# Patient Record
Sex: Female | Born: 2000
Health system: Southern US, Community
[De-identification: ages and names within clinical notes are randomized; demographics above are authoritative.]

## PROBLEM LIST (undated history)

## (undated) ENCOUNTER — Ambulatory Visit (HOSPITAL_COMMUNITY): Admission: EM | Payer: Medicaid Other | Source: Home / Self Care

## (undated) DIAGNOSIS — I10 Essential (primary) hypertension: Secondary | ICD-10-CM

## (undated) DIAGNOSIS — O1495 Unspecified pre-eclampsia, complicating the puerperium: Secondary | ICD-10-CM

## (undated) DIAGNOSIS — D649 Anemia, unspecified: Secondary | ICD-10-CM

## (undated) DIAGNOSIS — K219 Gastro-esophageal reflux disease without esophagitis: Secondary | ICD-10-CM

## (undated) HISTORY — PX: MULTIPLE TOOTH EXTRACTIONS: SHX2053

## (undated) HISTORY — DX: Gastro-esophageal reflux disease without esophagitis: K21.9

## (undated) HISTORY — PX: TYMPANOSTOMY TUBE PLACEMENT: SHX32

---

## 2019-04-12 LAB — OB RESULTS CONSOLE ABO/RH: RH Type: POSITIVE

## 2019-04-12 LAB — OB RESULTS CONSOLE GC/CHLAMYDIA
Chlamydia: NEGATIVE
Gonorrhea: NEGATIVE

## 2019-04-12 LAB — URINE CULTURE: Glucose 1 Hour: 110

## 2019-04-12 LAB — OB RESULTS CONSOLE ANTIBODY SCREEN: Antibody Screen: NEGATIVE

## 2019-04-15 ENCOUNTER — Telehealth: Payer: Self-pay | Admitting: Obstetrics & Gynecology

## 2019-04-15 NOTE — Telephone Encounter (Signed)
The patient was scheduled by the [facility] Helmut Muster). Stated the patient has high blood pressure. Scheduled the patient as a new ob with a high risk. Stated she has presumptive medicaid until Apr 23, 2019.

## 2019-04-20 NOTE — Telephone Encounter (Signed)
Opened in error

## 2019-04-22 ENCOUNTER — Encounter: Payer: Self-pay | Admitting: Obstetrics & Gynecology

## 2019-04-22 ENCOUNTER — Telehealth: Payer: Self-pay | Admitting: Obstetrics & Gynecology

## 2019-04-22 NOTE — Telephone Encounter (Signed)
Attempted to call patient several times today. Number always ringing busy.

## 2019-05-10 ENCOUNTER — Telehealth: Payer: Self-pay | Admitting: Obstetrics and Gynecology

## 2019-05-10 NOTE — Telephone Encounter (Signed)
The patient called in to reschedule the missed appointment.

## 2019-05-12 ENCOUNTER — Ambulatory Visit: Payer: Medicaid Other | Admitting: Obstetrics and Gynecology

## 2019-05-12 ENCOUNTER — Telehealth: Payer: Self-pay | Admitting: Family Medicine

## 2019-05-12 ENCOUNTER — Other Ambulatory Visit: Payer: Self-pay

## 2019-05-12 DIAGNOSIS — Z91199 Patient's noncompliance with other medical treatment and regimen due to unspecified reason: Secondary | ICD-10-CM

## 2019-05-12 DIAGNOSIS — Z5329 Procedure and treatment not carried out because of patient's decision for other reasons: Secondary | ICD-10-CM

## 2019-05-12 NOTE — Progress Notes (Signed)
Patient did not keep her OB transfer appointment for 05/12/2019.  Cornelia Copa MD Attending [facility] [employer] [facility])

## 2019-05-12 NOTE — Telephone Encounter (Signed)
Called patient for her visit today and she did not answer, it's now past her appt time so patient will be rescheduled

## 2019-05-18 DIAGNOSIS — H9212 Otorrhea, left ear: Secondary | ICD-10-CM | POA: Insufficient documentation

## 2019-05-19 ENCOUNTER — Encounter: Payer: Medicaid Other | Admitting: Obstetrics & Gynecology

## 2019-05-19 ENCOUNTER — Other Ambulatory Visit: Payer: Self-pay

## 2019-05-19 ENCOUNTER — Encounter: Payer: Self-pay | Admitting: Obstetrics & Gynecology

## 2019-05-19 DIAGNOSIS — O2340 Unspecified infection of urinary tract in pregnancy, unspecified trimester: Secondary | ICD-10-CM

## 2019-05-19 DIAGNOSIS — O10919 Unspecified pre-existing hypertension complicating pregnancy, unspecified trimester: Secondary | ICD-10-CM | POA: Insufficient documentation

## 2019-05-19 DIAGNOSIS — O0990 Supervision of high risk pregnancy, unspecified, unspecified trimester: Secondary | ICD-10-CM | POA: Insufficient documentation

## 2019-05-19 HISTORY — DX: Unspecified infection of urinary tract in pregnancy, unspecified trimester: O23.40

## 2019-05-19 NOTE — Progress Notes (Signed)
0830 I called [name] for her virtual visit and left a message I will call again in a few minutes; please be available by phone.  Linda,RN  [phone] I called [name] for her virtual visit and left a message I will call again in a few minutes; please be available by phone.  Linda,RN  [phone]  called [name] for her virtual visit and left a message she will need to be rescheduled since we did not reach her; please call our office to reschedule.  Linda,RN

## 2019-05-19 NOTE — Progress Notes (Deleted)
I have reviewed the chart and agree with nursing staff's documentation of this patient's encounter.  Scheryl Darter, MD 05/19/2019 11:37 AM

## 2019-05-26 ENCOUNTER — Other Ambulatory Visit: Payer: Self-pay

## 2019-05-26 ENCOUNTER — Encounter: Payer: Self-pay | Admitting: Obstetrics & Gynecology

## 2019-05-26 ENCOUNTER — Telehealth: Payer: Medicaid Other | Admitting: Family Medicine

## 2019-06-01 ENCOUNTER — Telehealth: Payer: Self-pay | Admitting: Family Medicine

## 2019-06-01 NOTE — Telephone Encounter (Signed)
Spoke with patient, she is aware that her visit is a Pharmacist, community visit.

## 2019-06-02 ENCOUNTER — Encounter: Payer: Self-pay | Admitting: Obstetrics and Gynecology

## 2019-06-02 ENCOUNTER — Other Ambulatory Visit: Payer: Self-pay

## 2019-06-02 ENCOUNTER — Telehealth (INDEPENDENT_AMBULATORY_CARE_PROVIDER_SITE_OTHER): Payer: Medicaid Other | Admitting: Obstetrics and Gynecology

## 2019-06-02 DIAGNOSIS — O10919 Unspecified pre-existing hypertension complicating pregnancy, unspecified trimester: Secondary | ICD-10-CM

## 2019-06-02 DIAGNOSIS — O2342 Unspecified infection of urinary tract in pregnancy, second trimester: Secondary | ICD-10-CM

## 2019-06-02 DIAGNOSIS — O10912 Unspecified pre-existing hypertension complicating pregnancy, second trimester: Secondary | ICD-10-CM | POA: Diagnosis not present

## 2019-06-02 DIAGNOSIS — O0990 Supervision of high risk pregnancy, unspecified, unspecified trimester: Secondary | ICD-10-CM

## 2019-06-02 DIAGNOSIS — Z3A26 26 weeks gestation of pregnancy: Secondary | ICD-10-CM | POA: Diagnosis not present

## 2019-06-02 DIAGNOSIS — D649 Anemia, unspecified: Secondary | ICD-10-CM | POA: Diagnosis not present

## 2019-06-02 DIAGNOSIS — O2340 Unspecified infection of urinary tract in pregnancy, unspecified trimester: Secondary | ICD-10-CM

## 2019-06-02 DIAGNOSIS — O99019 Anemia complicating pregnancy, unspecified trimester: Secondary | ICD-10-CM | POA: Insufficient documentation

## 2019-06-02 MED ORDER — PRENATAL MULTIVITAMIN CH: 1.0000 | ORAL_TABLET | Freq: Every day | ORAL | Status: DC

## 2019-06-02 MED ORDER — FERROUS SULFATE 325 (65 FE) MG PO TABS: 325.0000 mg | ORAL_TABLET | Freq: Two times a day (BID) | ORAL | 1 refills | Status: DC

## 2019-06-02 MED ORDER — CEPHALEXIN 500 MG PO CAPS: 500.0000 mg | ORAL_CAPSULE | Freq: Four times a day (QID) | ORAL | 2 refills | Status: DC

## 2019-06-02 MED ORDER — AMBULATORY NON FORMULARY MEDICATION: 1.0000 | 0 refills | Status: DC

## 2019-06-02 NOTE — Progress Notes (Signed)
I connected with  Anna Welch on 06/02/19 at  2:55 PM EDT by telephone and verified that I am speaking with the correct person using two identifiers.   I discussed the limitations, risks, security and privacy concerns of performing an evaluation and management service by telephone and the availability of in person appointments. I also discussed with the patient that there may be a patient responsible charge related to this service. The patient expressed understanding and agreed to proceed.  [place], [place] 06/02/2019  3:06 PM

## 2019-06-02 NOTE — Progress Notes (Signed)
[place] VIRTUAL VIDEO VISIT ENCOUNTER NOTE  Provider location: [facility] [place] [facility]   I connected with Everrett Coombe on 06/02/19 at  2:55 PM EDT by MyChart Video Encounter at home and verified that I am speaking with the correct person using two identifiers.   I discussed the limitations, risks, security and privacy concerns of performing an evaluation and management service by telephone and the availability of in person appointments. I also discussed with the patient that there may be a patient responsible charge related to this service. The patient expressed understanding and agreed to proceed.  Subjective:  Anna Welch is a 18 y.o. G2P0010 at 92w5dby LMP c/w being seen today for her initial prenatal visit. This is an unplanned pregnancy. She and partner are happy with the pregnancy. She was using nothing for birth control previously. She has an obstetric history significant for n/a. She has a medical history significant for cHTN, no meds, diagnosed at [facility].  Patient reports no complaints.  Contractions: Not present. Vag. Bleeding: None.  Movement: Present. Denies leaking of fluid.    Past Medical History:  Diagnosis Date  . Acid reflux     Past Surgical History:  Procedure Laterality Date  . MULTIPLE TOOTH EXTRACTIONS    . TYMPANOSTOMY TUBE PLACEMENT      OB History  Gravida Para Term Preterm AB Living  2       1    SAB TAB Ectopic Multiple Live Births  1            # Outcome Date GA Lbr Len/2nd Weight Sex Delivery Anes PTL Lv  2 Current           1 SAB 05/2018            Social History   Socioeconomic History  . Marital status: Unknown    Spouse name: Not on file  . Number of children: Not on file  . Years of education: Not on file  . Highest education level: Not on file  Occupational History  . Not on file  Social Needs  . Financial resource strain: Not on file  . Food insecurity:    Worry: Not on file   Inability: Not on file  . Transportation needs:    Medical: Not on file    Non-medical: Not on file  Tobacco Use  . Smoking status: Not on file  Substance and Sexual Activity  . Alcohol use: Not on file  . Drug use: Not on file  . Sexual activity: Not on file  Lifestyle  . Physical activity:    Days per week: Not on file    Minutes per session: Not on file  . Stress: Not on file  Relationships  . Social connections:    Talks on phone: Not on file    Gets together: Not on file    Attends religious service: Not on file    Active member of club or organization: Not on file    Attends meetings of clubs or organizations: Not on file    Relationship status: Not on file  Other Topics Concern  . Not on file  Social History Narrative  . Not on file    History reviewed. No pertinent family history.   Current Outpatient Medications:  .  AMBULATORY NON FORMULARY MEDICATION, 1 Device by Other route once a week. Blood Pressure Cuff/ Medium Monitored Regularly at home ICD 10: O09.90, Disp: 1 kit, Rfl: 0 .  cephALEXin (KEFLEX) 500 MG capsule, Take 1 capsule (500 mg total) by mouth 4 (four) times daily., Disp: 28 capsule, Rfl: 2 .  ferrous sulfate (FERROUSUL) 325 (65 FE) MG tablet, Take 1 tablet (325 mg total) by mouth 2 (two) times daily., Disp: 60 tablet, Rfl: 1  Current Facility-Administered Medications:  .  [START ON 06/03/2019] prenatal multivitamin tablet 1 tablet, 1 tablet, Oral, Q1200, Sloan Leiter, MD  Allergies  Allergen Reactions  . Nitrofurantoin Rash    Review of Systems: Negative except for what is mentioned in HPI.  Objective:  There were no vitals filed for this visit.  Fetal Status:     Movement: Present     General:  Alert, oriented and cooperative. Patient is in no acute distress.  Respiratory: Normal respiratory effort, no problems with respiration noted  Mental Status: Normal mood and affect. Normal behavior. Normal judgment and thought content.  Rest of  physical exam deferred due to type of encounter  Assessment and Plan:  Pregnancy: G2P0010 at 44w5dby LMP  1. Supervision of high risk pregnancy, antepartum - blood pressure cuff ordered today - AMBULATORY NON FORMULARY MEDICATION; 1 Device by Other route once a week. Blood Pressure Cuff/ Medium Monitored Regularly at home ICD 10: O09.90  Dispense: 1 kit; Refill: 0 - prenatal multivitamin tablet 1 tablet - Reviewed [facility], multiple providers, fellows, medical students, virtual visits, MyChart.   2. Preexisting hypertension complicating pregnancy, antepartum - was given baby aspirin but stopped taking it, encouraged her to take it and explained why - AMBULATORY NON FORMULARY MEDICATION; 1 Device by Other route once a week. Blood Pressure Cuff/ Medium Monitored Regularly at home ICD 10: O09.90  Dispense: 1 kit; Refill: 0  3. Urinary tract infection in mother during pregnancy, antepartum Sent keflex to pharmacy   Preterm labor symptoms and general obstetric precautions including but not limited to vaginal bleeding, contractions, leaking of fluid and fetal movement were reviewed in detail with the patient. I discussed the assessment and treatment plan with the patient. The patient was provided an opportunity to ask questions and all were answered. The patient agreed with the plan and demonstrated an understanding of the instructions. The patient was advised to call back or seek an in-person office evaluation/[facility] Please refer to After Visit Summary for other counseling recommendations.   I provided 16 minutes of face-to-face time during this encounter.   Return in about 2 weeks (around 06/16/2019) for OB visit (MD), in person, 3rd trim labs, 2 hr GTT.  KSloan Leiter6/09/2019 3:50 PM

## 2019-06-06 ENCOUNTER — Other Ambulatory Visit: Payer: Self-pay

## 2019-06-06 ENCOUNTER — Encounter (HOSPITAL_COMMUNITY): Payer: Self-pay

## 2019-06-06 ENCOUNTER — Inpatient Hospital Stay (HOSPITAL_COMMUNITY)
Admission: AD | Admit: 2019-06-06 | Discharge: 2019-06-07 | Disposition: A | Discharge status: Home / Self Care | Payer: Medicaid Other | Attending: Obstetrics & Gynecology | Admitting: Obstetrics & Gynecology

## 2019-06-06 ENCOUNTER — Inpatient Hospital Stay (HOSPITAL_BASED_OUTPATIENT_CLINIC_OR_DEPARTMENT_OTHER): Payer: Medicaid Other

## 2019-06-06 DIAGNOSIS — O26852 Spotting complicating pregnancy, second trimester: Secondary | ICD-10-CM | POA: Insufficient documentation

## 2019-06-06 DIAGNOSIS — Z3A27 27 weeks gestation of pregnancy: Secondary | ICD-10-CM | POA: Diagnosis not present

## 2019-06-06 DIAGNOSIS — Z8742 Personal history of other diseases of the female genital tract: Secondary | ICD-10-CM

## 2019-06-06 DIAGNOSIS — Z3689 Encounter for other specified antenatal screening: Secondary | ICD-10-CM

## 2019-06-06 DIAGNOSIS — O4692 Antepartum hemorrhage, unspecified, second trimester: Secondary | ICD-10-CM

## 2019-06-06 LAB — URINALYSIS, ROUTINE W REFLEX MICROSCOPIC
Bilirubin Urine: NEGATIVE
Glucose, UA: NEGATIVE mg/dL
Ketones, ur: NEGATIVE mg/dL
Nitrite: NEGATIVE
Protein, ur: 30 mg/dL — AB
Specific Gravity, Urine: 1.035 — ABNORMAL HIGH (ref 1.005–1.030)
pH: 5 (ref 5.0–8.0)

## 2019-06-06 LAB — COMPREHENSIVE METABOLIC PANEL
ALT: 15 U/L (ref 0–44)
AST: 19 U/L (ref 15–41)
Albumin: 3 g/dL — ABNORMAL LOW (ref 3.5–5.0)
Alkaline Phosphatase: 58 U/L (ref 38–126)
Anion gap: 6 (ref 5–15)
BUN: 7 mg/dL (ref 6–20)
CO2: 24 mmol/L (ref 22–32)
Calcium: 9.5 mg/dL (ref 8.9–10.3)
Chloride: 108 mmol/L (ref 98–111)
Creatinine, Ser: 0.58 mg/dL (ref 0.44–1.00)
GFR calc Af Amer: >60 mL/min (ref >60)
GFR calc non Af Amer: >60 mL/min (ref >60)
Glucose, Bld: 95 mg/dL (ref 70–99)
Potassium: 3.9 mmol/L (ref 3.5–5.1)
Sodium: 138 mmol/L (ref 135–145)
Total Bilirubin: 0.2 mg/dL — ABNORMAL LOW (ref 0.3–1.2)
Total Protein: 6.4 g/dL — ABNORMAL LOW (ref 6.5–8.1)

## 2019-06-06 LAB — WET PREP, GENITAL
Clue Cells Wet Prep HPF POC: NONE SEEN
Sperm: NONE SEEN
Trich, Wet Prep: NONE SEEN

## 2019-06-06 LAB — CBC
HCT: 28.5 % — ABNORMAL LOW (ref 36.0–46.0)
Hemoglobin: 8.7 g/dL — ABNORMAL LOW (ref 12.0–15.0)
MCH: 21.1 pg — ABNORMAL LOW (ref 26.0–34.0)
MCHC: 30.5 g/dL (ref 30.0–36.0)
MCV: 69.2 fL — ABNORMAL LOW (ref 80.0–100.0)
Platelets: 177 10*3/uL (ref 150–400)
RBC: 4.12 MIL/uL (ref 3.87–5.11)
RDW: 17 % — ABNORMAL HIGH (ref 11.5–15.5)
WBC: 7.3 10*3/uL (ref 4.0–10.5)
nRBC: 0 % (ref 0.0–0.2)

## 2019-06-06 LAB — PROTEIN / CREATININE RATIO, URINE
Creatinine, Urine: 317.52 mg/dL
Protein Creatinine Ratio: 0.06 mg/mg{Cre} (ref 0.00–0.15)
Total Protein, Urine: 18 mg/dL

## 2019-06-06 MED ORDER — TERCONAZOLE 0.4 % VA CREA: 1.0000 | TOPICAL_CREAM | Freq: Every day | VAGINAL | 0 refills | Status: DC

## 2019-06-06 MED ORDER — ASPIRIN 81 MG PO CHEW: 81.0000 mg | CHEWABLE_TABLET | Freq: Every day | ORAL | 2 refills | Status: DC

## 2019-06-06 NOTE — MAU Provider Note (Signed)
Patient Anna Welch is a 18 y.o. G2P0010 At 72w2dhere with complaints of one episode of vaginal spotting this evening and a cramp this morning. She denies abnormal discharge, decreased fetal movements, LOF, dysuria, low back pain, or other ob-gyn complaint.   She is a CHTN not on medicine; [facility]  History     CSN: [id number] Arrival date and time: 06/06/19 2012   None     No chief complaint on file.  Vaginal Bleeding The patient's primary symptoms include vaginal bleeding. This is a new problem. The current episode started today. The problem occurs rarely. The problem has been resolved. She is pregnant. The vaginal discharge was bloody. The vaginal bleeding is spotting. She has not been passing clots. She has not been passing tissue. Nothing aggravates the symptoms. She has tried nothing for the symptoms.   Patient states that she had bright red blood on her toilet paper when she wiped this evening at 6:30pm. She also felt a cramp this morning that lasted two minutes. She was concerned about a miscarriage so she came in to be seen.  OB History    Gravida  2   Para      Term      Preterm      AB  1   Living        SAB  1   TAB      Ectopic      Multiple      Live Births              Past Medical History:  Diagnosis Date  . Acid reflux     Past Surgical History:  Procedure Laterality Date  . MULTIPLE TOOTH EXTRACTIONS    . TYMPANOSTOMY TUBE PLACEMENT      History reviewed. No pertinent family history.  Social History   Tobacco Use  . Smoking status: Never Smoker  Substance Use Topics  . Alcohol use: Not Currently  . Drug use: Not Currently    Allergies:  Allergies  Allergen Reactions  . Nitrofurantoin Rash    Facility-Administered Medications Prior to Admission  Medication Dose Route Frequency Provider Last Rate Last Dose  . prenatal multivitamin tablet 1 tablet  1 tablet Oral  Q1200 DSloan Leiter MD       Medications Prior to Admission  Medication Sig Dispense Refill Last Dose  . AMBULATORY NON FORMULARY MEDICATION 1 Device by Other route once a week. Blood Pressure Cuff/ Medium Monitored Regularly at home ICD 10: O09.90 1 kit 0   . cephALEXin (KEFLEX) 500 MG capsule Take 1 capsule (500 mg total) by mouth 4 (four) times daily. 28 capsule 2   . ferrous sulfate (FERROUSUL) 325 (65 FE) MG tablet Take 1 tablet (325 mg total) by mouth 2 (two) times daily. 60 tablet 1     Review of Systems  Constitutional: Negative.   HENT: Negative.   Respiratory: Negative.   Gastrointestinal: Negative.   Genitourinary: Positive for vaginal bleeding.  Neurological: Negative.   Psychiatric/Behavioral: Negative.    Physical Exam   Blood pressure 130/79, pulse (!) 102, temperature 98.2 F (36.8 C), resp. rate 16, height '5\' 8"'  (1.727 m), weight (!) 137 kg, last menstrual period 11/27/2018.  Physical Exam  Constitutional: She is oriented to person, place, and time. She appears well-developed and well-nourished.  HENT:  Head: Normocephalic.  Neck: Normal range of motion.  Respiratory: Effort normal.  GI: Soft. Bowel sounds are normal.  Genitourinary:    Vagina normal.     Genitourinary Comments: NEFG; vaginal walls and pink and ruggated, no lesions on vaginal walls or cervix. Light pink discharge in the vagina, no active bleeding. White clumpy discharge also present in the vagina.    Musculoskeletal: Normal range of motion.  Neurological: She is alert and oriented to person, place, and time.  Skin: Skin is warm and dry.  Psychiatric: She has a normal mood and affect.  Cervix is long, closed and thick.   MAU Course  Procedures  MDM -NST: 145 bpm, mod var, present acel, neg decels, no contractions.  -Patient has CHTN; will send baseline labs as patient not seen again until end of June and has had limited prenatal care in this pregnancy.  -will re-send urine for culture  as patient did not take her keflex  -US shows no evidence of placental previa or abruption; patient has had no vaginal bleeding while in MAU.  Assessment and Plan   1. H/O vaginal bleeding   2. NST (non-stress test) reactive    -continue taking baby ASA -keep appt on 6-26 -Urine culture sent; knows that we will call her with results if she needs to change her treatment, otherwise she should keep taking her keflex. -RX for [employer] for yeast given -baseline labs pending -Reviewed warning signs and when to return to MAU.  Mervyn Skeeters [place] 06/06/2019, 9:37 PM

## 2019-06-06 NOTE — MAU Note (Signed)
Pt felt mild lower abd pain earlier today that has since gone away. AT 1830 tonight she started noticing scant red vag bleeding. Reports good fetal movement, denies any vag leaking of fluid.

## 2019-06-06 NOTE — Discharge Instructions (Signed)
-take baby aspirin every day -Take Terazole for 7 days at night (put a pad on) for yeast  -Take Keflex 4 times a day for 10 days, as you were prescribed by Dr. Rosana Hoes.   Preeclampsia and Eclampsia  Preeclampsia is a serious condition that may develop during pregnancy. It is also called toxemia of pregnancy. This condition causes high blood pressure along with other symptoms, such as swelling and headaches. These symptoms may develop as the condition gets worse. Preeclampsia may occur at 20 weeks of pregnancy or later. Diagnosing and treating preeclampsia early is very important. If not treated early, it can cause serious problems for you and your baby. One problem it can lead to is eclampsia. Eclampsia is a condition that causes muscle jerking or shaking (convulsions or seizures) and other serious problems for the mother. During pregnancy, delivering your baby may be the best treatment for preeclampsia or eclampsia. For most women, preeclampsia and eclampsia symptoms go away after giving birth. In rare cases, a woman may develop preeclampsia after giving birth (postpartum preeclampsia). This usually occurs within 48 hours after childbirth but may occur up to 6 weeks after giving birth. What are the causes? The cause of preeclampsia is not known. What increases the risk? The following risk factors make you more likely to develop preeclampsia:  Being pregnant for the first time.  Having had preeclampsia during a past pregnancy.  Having a family history of preeclampsia.  Having high blood pressure.  Being pregnant with more than one baby.  Being 98 or older.  Being African-American.  Having kidney disease or diabetes.  Having medical conditions such as lupus or blood diseases.  Being very overweight (obese). What are the signs or symptoms? The earliest signs of preeclampsia are:  High blood pressure.  Increased protein in your urine. Your health care provider will check for this at  every visit before you give birth (prenatal visit). Other symptoms that may develop as the condition gets worse include:  Severe headaches.  Sudden weight gain.  Swelling of the hands, face, legs, and feet.  Nausea and vomiting.  Vision problems, such as blurred or double vision.  Numbness in the face, arms, legs, and feet.  Urinating less than usual.  Dizziness.  Slurred speech.  Abdominal pain, especially upper abdominal pain.  Convulsions or seizures. How is this diagnosed? There are no screening tests for preeclampsia. Your health care provider will ask you about symptoms and check for signs of preeclampsia during your prenatal visits. You may also have tests that include:  Urine tests.  Blood tests.  Checking your blood pressure.  Monitoring your babys heart rate.  Ultrasound. How is this treated? You and your health care provider will determine the treatment approach that is best for you. Treatment may include:  Having more frequent prenatal exams to check for signs of preeclampsia, if you have an increased risk for preeclampsia.  Medicine to lower your blood pressure.  Staying in the hospital, if your condition is severe. There, treatment will focus on controlling your blood pressure and the amount of fluids in your body (fluid retention).  Taking medicine (magnesium sulfate) to prevent seizures. This may be given as an injection or through an IV.  Taking a low-dose aspirin during your pregnancy.  Delivering your baby early, if your condition gets worse. You may have your labor started with medicine (induced), or you may have a cesarean delivery. Follow these instructions at home: Eating and drinking   Drink enough fluid  to keep your urine pale yellow.  Avoid caffeine. Lifestyle  Do not use any products that contain nicotine or tobacco, such as cigarettes and e-cigarettes. If you need help quitting, ask your health care provider.  Do not use  alcohol or drugs.  Avoid stress as much as possible. Rest and get plenty of sleep. General instructions  Take over-the-counter and prescription medicines only as told by your health care provider.  When lying down, lie on your left side. This keeps pressure off your major blood vessels.  When sitting or lying down, raise (elevate) your feet. Try putting some pillows underneath your lower legs.  Exercise regularly. Ask your health care provider what kinds of exercise are best for you.  Keep all follow-up and prenatal visits as told by your health care provider. This is important. How is this prevented? There is no known way of preventing preeclampsia or eclampsia from developing. However, to lower your risk of complications and detect problems early:  Get regular prenatal care. Your health care provider may be able to diagnose and treat the condition early.  Maintain a healthy weight. Ask your health care provider for help managing weight gain during pregnancy.  Work with your health care provider to manage any long-term (chronic) health conditions you have, such as diabetes or kidney problems.  You may have tests of your blood pressure and kidney function after giving birth.  Your health care provider may have you take low-dose aspirin during your next pregnancy. Contact a health care provider if:  You have symptoms that your health care provider told you may require more treatment or monitoring, such as: ? Headaches. ? Nausea or vomiting. ? Abdominal pain. ? Dizziness. ? Light-headedness. Get help right away if:  You have severe: ? Abdominal pain. ? Headaches that do not get better. ? Dizziness. ? Vision problems. ? Confusion. ? Nausea or vomiting.  You have any of the following: ? A seizure. ? Sudden, rapid weight gain. ? Sudden swelling in your hands, ankles, or face. ? Trouble moving any part of your body. ? Numbness in any part of your body. ? Trouble  speaking. ? Abnormal bleeding.  You faint. Summary  Preeclampsia is a serious condition that may develop during pregnancy. It is also called toxemia of pregnancy.  This condition causes high blood pressure along with other symptoms, such as swelling and headaches.  Diagnosing and treating preeclampsia early is very important. If not treated early, it can cause serious problems for you and your baby.  Get help right away if you have symptoms that your health care provider told you to watch for. This information is not intended to replace advice given to you by your health care provider. Make sure you discuss any questions you have with your health care provider. Document Released: 12/06/2000 Document Revised: 11/25/2017 Document Reviewed: 07/15/2016 Elsevier Interactive Patient Education  2019 [employer]

## 2019-06-07 ENCOUNTER — Telehealth: Payer: Self-pay | Admitting: Advanced Practice Midwife

## 2019-06-07 ENCOUNTER — Other Ambulatory Visit: Payer: Self-pay | Admitting: Student

## 2019-06-07 ENCOUNTER — Telehealth: Payer: Self-pay | Admitting: General Practice

## 2019-06-07 DIAGNOSIS — D508 Other iron deficiency anemias: Secondary | ICD-10-CM

## 2019-06-07 LAB — GC/CHLAMYDIA PROBE AMP (~~LOC~~) NOT AT ARMC
Chlamydia: NEGATIVE
Neisseria Gonorrhea: NEGATIVE

## 2019-06-07 MED ORDER — FERROUS FUMARATE 325 (106 FE) MG PO TABS: 1.0000 | ORAL_TABLET | Freq: Every day | ORAL | 3 refills | Status: DC

## 2019-06-07 NOTE — Telephone Encounter (Signed)
-----   Message from Starr Lake, [place] sent at 06/07/2019  7:43 AM EDT ----- Regarding: patient needs iron infusion Hello! This patient has a hemoglobin of 8.7; she is asymptomatic. SHe needs an iron infusion weekly for two weeks; do you mind to call her and set it up? I just tried to call her to tell her she would be contacted about it, and she was asleep.  Thank you!  Maye Hides

## 2019-06-07 NOTE — Telephone Encounter (Signed)
Scheduled 1st feraheme infusion 6/19 @ 10am. Called patient, no answer- left message to call us back regarding an appt we have set up for her later this week.  Will send mychart message.

## 2019-06-07 NOTE — Telephone Encounter (Signed)
The patient called in stating she received a call and was told to call back. Informed the patient of the upcoming appointment as the previous call was intended for the same purpose. The patient stated she is aware appointment date and times.

## 2019-06-09 LAB — CULTURE, OB URINE: Culture: 30000 — AB

## 2019-06-11 ENCOUNTER — Encounter (HOSPITAL_COMMUNITY)
Admission: RE | Admit: 2019-06-11 | Discharge: 2019-06-11 | Disposition: A | Discharge status: Home / Self Care | Payer: Medicaid Other | Source: Ambulatory Visit | Attending: Student | Admitting: Student

## 2019-06-11 ENCOUNTER — Other Ambulatory Visit: Payer: Self-pay

## 2019-06-11 DIAGNOSIS — O99012 Anemia complicating pregnancy, second trimester: Secondary | ICD-10-CM | POA: Insufficient documentation

## 2019-06-11 MED ORDER — SODIUM CHLORIDE 0.9 % IV SOLN
510.0000 mg | INTRAVENOUS | Status: DC
  Administered 2019-06-11: 11:00:00 510 mg via INTRAVENOUS
  Filled 2019-06-11: qty 510

## 2019-06-11 NOTE — Discharge Instructions (Signed)

## 2019-06-17 ENCOUNTER — Telehealth: Payer: Self-pay | Admitting: Family Medicine

## 2019-06-17 ENCOUNTER — Other Ambulatory Visit: Payer: Self-pay | Admitting: General Practice

## 2019-06-17 DIAGNOSIS — O0990 Supervision of high risk pregnancy, unspecified, unspecified trimester: Secondary | ICD-10-CM

## 2019-06-17 NOTE — Telephone Encounter (Signed)
Attempted to call patient to ask her about any symptoms, and to wear her mask the whole visit covering her nose and mouth, and no visitors.  Also, to sanitize her hands upon arriving. A message was left on her voicemail.  °

## 2019-06-18 ENCOUNTER — Inpatient Hospital Stay (HOSPITAL_COMMUNITY)
Admission: RE | Admit: 2019-06-18 | Discharge: 2019-06-18 | Disposition: A | Discharge status: Home / Self Care | Payer: Medicaid Other | Source: Ambulatory Visit | Attending: Student | Admitting: Student

## 2019-06-18 ENCOUNTER — Encounter: Payer: Medicaid Other | Admitting: Family Medicine

## 2019-06-18 ENCOUNTER — Encounter: Payer: Self-pay | Admitting: Family Medicine

## 2019-06-18 ENCOUNTER — Telehealth: Payer: Self-pay | Admitting: Family Medicine

## 2019-06-18 ENCOUNTER — Other Ambulatory Visit: Payer: Medicaid Other

## 2019-06-18 NOTE — Telephone Encounter (Signed)
Called Anna Welch to get her appointment rescheduled. I spoke with Celesta Gentile, and he stated she had already gone to her appointment. He stated she had three appointments today, and wanted to know was everything ok. I just asked him to have her to call our office. He said ok.

## 2019-06-18 NOTE — Progress Notes (Signed)
Patient did not keep appointment today. She will be called to reschedule.  

## 2019-06-28 ENCOUNTER — Telehealth: Payer: Self-pay | Admitting: Family Medicine

## 2019-06-28 ENCOUNTER — Encounter: Payer: Self-pay | Admitting: Family Medicine

## 2019-06-28 NOTE — Telephone Encounter (Signed)
Attempted to call patient about her appointment. Left a message on her voice mailbox to call us back to get scheduled.

## 2019-06-29 ENCOUNTER — Telehealth: Payer: Self-pay | Admitting: Obstetrics & Gynecology

## 2019-06-29 NOTE — Telephone Encounter (Signed)
Left a message for her to get in touch with [place]. It is important that we reach you about your appointment.

## 2019-06-30 ENCOUNTER — Encounter: Payer: Self-pay | Admitting: *Deleted

## 2019-06-30 ENCOUNTER — Telehealth: Payer: Self-pay | Admitting: Obstetrics & Gynecology

## 2019-06-30 ENCOUNTER — Encounter: Payer: Medicaid Other | Admitting: Obstetrics & Gynecology

## 2019-06-30 NOTE — Telephone Encounter (Signed)
Called the patient in regards to the missed appointment. Left a detailed voicemail. Also mailing a missed appointment letter.

## 2019-06-30 NOTE — Progress Notes (Deleted)
   Patient did not show up today for her scheduled appointment.   Glennice Marcos, MD, FACOG Obstetrician & Gynecologist, [facility], [facility]  

## 2019-06-30 NOTE — Progress Notes (Signed)
Per pt mychart message, pt is no longer getting her prenatal care from [place] but instead at [facility] in [place].

## 2019-07-07 NOTE — Addendum Note (Signed)
Encounter addended by: Starr Lake, CNM on: 07/07/2019 9:39 AM  Actions taken: Problem List modified

## 2019-09-14 ENCOUNTER — Emergency Department (HOSPITAL_COMMUNITY): Payer: Medicaid Other

## 2019-09-14 ENCOUNTER — Encounter (HOSPITAL_COMMUNITY): Payer: Self-pay | Admitting: *Deleted

## 2019-09-14 ENCOUNTER — Inpatient Hospital Stay (HOSPITAL_COMMUNITY)
Admission: AD | Admit: 2019-09-14 | Discharge: 2019-09-17 | DRG: 776 | Disposition: A | Discharge status: Home / Self Care | Payer: Medicaid Other | Attending: Obstetrics and Gynecology | Admitting: Obstetrics and Gynecology

## 2019-09-14 ENCOUNTER — Other Ambulatory Visit: Payer: Self-pay

## 2019-09-14 ENCOUNTER — Inpatient Hospital Stay (HOSPITAL_COMMUNITY): Payer: Medicaid Other

## 2019-09-14 DIAGNOSIS — O9953 Diseases of the respiratory system complicating the puerperium: Secondary | ICD-10-CM | POA: Diagnosis present

## 2019-09-14 DIAGNOSIS — R0602 Shortness of breath: Secondary | ICD-10-CM | POA: Diagnosis present

## 2019-09-14 DIAGNOSIS — R0902 Hypoxemia: Secondary | ICD-10-CM

## 2019-09-14 DIAGNOSIS — R778 Other specified abnormalities of plasma proteins: Secondary | ICD-10-CM

## 2019-09-14 DIAGNOSIS — O8600 Infection of obstetric surgical wound, unspecified: Secondary | ICD-10-CM | POA: Diagnosis present

## 2019-09-14 DIAGNOSIS — J810 Acute pulmonary edema: Secondary | ICD-10-CM

## 2019-09-14 DIAGNOSIS — Z20828 Contact with and (suspected) exposure to other viral communicable diseases: Secondary | ICD-10-CM | POA: Diagnosis present

## 2019-09-14 DIAGNOSIS — O1093 Unspecified pre-existing hypertension complicating the puerperium: Secondary | ICD-10-CM | POA: Diagnosis present

## 2019-09-14 DIAGNOSIS — O1003 Pre-existing essential hypertension complicating the puerperium: Secondary | ICD-10-CM | POA: Diagnosis present

## 2019-09-14 DIAGNOSIS — O10919 Unspecified pre-existing hypertension complicating pregnancy, unspecified trimester: Secondary | ICD-10-CM

## 2019-09-14 DIAGNOSIS — O2343 Unspecified infection of urinary tract in pregnancy, third trimester: Secondary | ICD-10-CM

## 2019-09-14 DIAGNOSIS — I340 Nonrheumatic mitral (valve) insufficiency: Secondary | ICD-10-CM

## 2019-09-14 DIAGNOSIS — O115 Pre-existing hypertension with pre-eclampsia, complicating the puerperium: Principal | ICD-10-CM | POA: Diagnosis present

## 2019-09-14 DIAGNOSIS — O0990 Supervision of high risk pregnancy, unspecified, unspecified trimester: Secondary | ICD-10-CM

## 2019-09-14 DIAGNOSIS — Z98891 History of uterine scar from previous surgery: Secondary | ICD-10-CM

## 2019-09-14 LAB — BASIC METABOLIC PANEL
Anion gap: 8 (ref 5–15)
BUN: <5 mg/dL — ABNORMAL LOW (ref 6–20)
CO2: 24 mmol/L (ref 22–32)
Calcium: 8.7 mg/dL — ABNORMAL LOW (ref 8.9–10.3)
Chloride: 110 mmol/L (ref 98–111)
Creatinine, Ser: 0.65 mg/dL (ref 0.44–1.00)
GFR calc Af Amer: >60 mL/min (ref >60)
GFR calc non Af Amer: >60 mL/min (ref >60)
Glucose, Bld: 92 mg/dL (ref 70–99)
Potassium: 4 mmol/L (ref 3.5–5.1)
Sodium: 142 mmol/L (ref 135–145)

## 2019-09-14 LAB — CBC
HCT: 23.6 % — ABNORMAL LOW (ref 36.0–46.0)
Hemoglobin: 7.1 g/dL — ABNORMAL LOW (ref 12.0–15.0)
MCH: 23.3 pg — ABNORMAL LOW (ref 26.0–34.0)
MCHC: 30.1 g/dL (ref 30.0–36.0)
MCV: 77.4 fL — ABNORMAL LOW (ref 80.0–100.0)
Platelets: 240 10*3/uL (ref 150–400)
RBC: 3.05 MIL/uL — ABNORMAL LOW (ref 3.87–5.11)
RDW: 17.2 % — ABNORMAL HIGH (ref 11.5–15.5)
WBC: 7.8 10*3/uL (ref 4.0–10.5)
nRBC: 0.6 % — ABNORMAL HIGH (ref 0.0–0.2)

## 2019-09-14 LAB — HEPATIC FUNCTION PANEL
ALT: 17 U/L (ref 0–44)
AST: 21 U/L (ref 15–41)
Albumin: 2.7 g/dL — ABNORMAL LOW (ref 3.5–5.0)
Alkaline Phosphatase: 82 U/L (ref 38–126)
Bilirubin, Direct: <0.1 mg/dL (ref 0.0–0.2)
Total Bilirubin: 0.4 mg/dL (ref 0.3–1.2)
Total Protein: 6 g/dL — ABNORMAL LOW (ref 6.5–8.1)

## 2019-09-14 LAB — TROPONIN I (HIGH SENSITIVITY)
Troponin I (High Sensitivity): 66 ng/L — ABNORMAL HIGH (ref <18)
Troponin I (High Sensitivity): 50 ng/L — ABNORMAL HIGH (ref <18)

## 2019-09-14 LAB — SARS CORONAVIRUS 2 (TAT 6-24 HRS): SARS Coronavirus 2: NEGATIVE

## 2019-09-14 LAB — ECHOCARDIOGRAM COMPLETE: Height: 68 in

## 2019-09-14 LAB — D-DIMER, QUANTITATIVE: D-Dimer, Quant: 7.55 ug/mL-FEU — ABNORMAL HIGH (ref 0.00–0.50)

## 2019-09-14 MED ORDER — LABETALOL HCL 5 MG/ML IV SOLN
INTRAVENOUS | Status: AC
  Administered 2019-09-14: 40 mg via INTRAVENOUS
  Filled 2019-09-14: qty 4

## 2019-09-14 MED ORDER — FUROSEMIDE 10 MG/ML IJ SOLN
10.0000 mg | Freq: Two times a day (BID) | INTRAMUSCULAR | Status: AC
  Administered 2019-09-14 – 2019-09-15 (×4): 10 mg via INTRAVENOUS
  Filled 2019-09-14 (×4): qty 2

## 2019-09-14 MED ORDER — FUROSEMIDE 10 MG/ML IJ SOLN: 10.0000 mg | Freq: Two times a day (BID) | INTRAMUSCULAR | Status: DC

## 2019-09-14 MED ORDER — ENOXAPARIN SODIUM 80 MG/0.8ML ~~LOC~~ SOLN
70.0000 mg | SUBCUTANEOUS | Status: DC
  Administered 2019-09-14 – 2019-09-16 (×3): 70 mg via SUBCUTANEOUS
  Filled 2019-09-14 (×3): qty 0.8

## 2019-09-14 MED ORDER — MAGNESIUM SULFATE BOLUS VIA INFUSION
4.0000 g | Freq: Once | INTRAVENOUS | Status: AC
  Administered 2019-09-14: 4 g via INTRAVENOUS
  Filled 2019-09-14: qty 500

## 2019-09-14 MED ORDER — LISINOPRIL 10 MG PO TABS
10.0000 mg | ORAL_TABLET | Freq: Every day | ORAL | Status: DC
  Administered 2019-09-14 – 2019-09-15 (×2): 10 mg via ORAL
  Filled 2019-09-14 (×3): qty 1

## 2019-09-14 MED ORDER — LABETALOL HCL 5 MG/ML IV SOLN
10.0000 mg | Freq: Once | INTRAVENOUS | Status: AC
  Administered 2019-09-14: 09:00:00 10 mg via INTRAVENOUS
  Filled 2019-09-14: qty 4

## 2019-09-14 MED ORDER — LACTATED RINGERS IV SOLN
INTRAVENOUS | Status: DC
  Administered 2019-09-14 – 2019-09-15 (×2): via INTRAVENOUS

## 2019-09-14 MED ORDER — IOHEXOL 350 MG/ML SOLN
100.0000 mL | Freq: Once | INTRAVENOUS | Status: AC | PRN
  Administered 2019-09-14: 75 mL via INTRAVENOUS

## 2019-09-14 MED ORDER — SODIUM CHLORIDE 0.9% FLUSH: 3.0000 mL | Freq: Once | INTRAVENOUS | Status: DC

## 2019-09-14 MED ORDER — MAGNESIUM SULFATE 40 G IN LACTATED RINGERS - SIMPLE
2.0000 g/h | INTRAVENOUS | Status: AC
  Administered 2019-09-14 – 2019-09-15 (×2): 2 g/h via INTRAVENOUS
  Filled 2019-09-14 (×2): qty 500

## 2019-09-14 MED ORDER — LABETALOL HCL 5 MG/ML IV SOLN
20.0000 mg | INTRAVENOUS | Status: DC | PRN
  Administered 2019-09-14: 20 mg via INTRAVENOUS

## 2019-09-14 MED ORDER — LABETALOL HCL 5 MG/ML IV SOLN
80.0000 mg | INTRAVENOUS | Status: DC | PRN
  Administered 2019-09-14: 80 mg via INTRAVENOUS
  Filled 2019-09-14: qty 16

## 2019-09-14 MED ORDER — HYDRALAZINE HCL 20 MG/ML IJ SOLN
10.0000 mg | INTRAMUSCULAR | Status: DC | PRN
  Administered 2019-09-14: 10 mg via INTRAVENOUS
  Filled 2019-09-14: qty 1

## 2019-09-14 MED ORDER — LABETALOL HCL 5 MG/ML IV SOLN
40.0000 mg | INTRAVENOUS | Status: DC | PRN
  Administered 2019-09-14: 16:00:00 40 mg via INTRAVENOUS
  Filled 2019-09-14: qty 8

## 2019-09-14 NOTE — ED Provider Notes (Signed)
[facility] Emergency Department Provider Note MRN:  [id number]  Arrival date & time: 09/14/19     Chief Complaint   Cough and Shortness of Breath   History of Present Illness   Anna Welch is a 18 y.o. year-old female with no pertinent past medical history presenting to the ED with chief complaint of cough and shortness of breath.  Yesterday evening and this morning patient noticed significant dyspnea on exertion.  Unable to take 4 or 5 strides without having to stop to catch her breath.  Very out of the ordinary.  Had a recent C-section delivery 1 week ago.  Delivered at 41 weeks, uncomplicated pregnancy other than gestational hypertension.  Patient denies any fever, no issues with her surgical scar, no significant vaginal bleeding or discharge or leakage of fluid.  She denies chest pain, denies abdominal pain.  Endorsing some lightheadedness with ambulating as well.  Symptoms are moderate, constant, worse with ambulation.  Review of Systems  A complete 10 system review of systems was obtained and all systems are negative except as noted in the HPI and PMH.   Patient's Health History    Past Medical History:  Diagnosis Date   Acid reflux     Past Surgical History:  Procedure Laterality Date   MULTIPLE TOOTH EXTRACTIONS     TYMPANOSTOMY TUBE PLACEMENT      History reviewed. No pertinent family history.  Social History   Socioeconomic History   Marital status: Single    Spouse name: Not on file   Number of children: Not on file   Years of education: Not on file   Highest education level: Not on file  Occupational History   Not on file  Social Needs   Financial resource strain: Not on file   Food insecurity    Worry: Not on file    Inability: Not on file   Transportation needs    Medical: Not on file    Non-medical: Not on file  Tobacco Use   Smoking status: Never Smoker  Substance and Sexual Activity   Alcohol use: Not  Currently   Drug use: Not Currently   Sexual activity: Yes    Birth control/protection: None    Comment: last IC-couple days  Lifestyle   Physical activity    Days per week: Not on file    Minutes per session: Not on file   Stress: Not on file  Relationships   Social connections    Talks on phone: Not on file    Gets together: Not on file    Attends religious service: Not on file    Active member of club or organization: Not on file    Attends meetings of clubs or organizations: Not on file    Relationship status: Not on file   Intimate partner violence    Fear of current or ex partner: Not on file    Emotionally abused: Not on file    Physically abused: Not on file    Forced sexual activity: Not on file  Other Topics Concern   Not on file  Social History Narrative   Not on file     Physical Exam  Vital Signs and Nursing Notes reviewed Vitals:   09/14/19 0930 09/14/19 0945  BP: (!) 150/81 (!) 157/104  Pulse: (!) 102 94  Resp: (!) 25 (!) 24  Temp:    SpO2: 94% 94%    CONSTITUTIONAL: Well-appearing, NAD NEURO:  Alert and oriented x 3, no  focal deficits EYES:  eyes equal and reactive ENT/NECK:  no LAD, no JVD CARDIO: Tachycardic rate, well-perfused, normal S1 and S2 PULM:  CTAB no wheezing or rhonchi, mildly tachypneic GI/GU:  normal bowel sounds, non-distended, non-tender MSK/SPINE:  No gross deformities, no edema SKIN:  no rash, atraumatic PSYCH:  Appropriate speech and behavior  Diagnostic and Interventional Summary    EKG Interpretation  Date/Time:  Tuesday September 14 2019 07:10:17 EDT Ventricular Rate:  110 PR Interval:  132 QRS Duration: 90 QT Interval:  324 QTC Calculation: 438 R Axis:   83 Text Interpretation:  Sinus tachycardia Otherwise normal ECG Confirmed by Kennis Carina [phone]) on 09/14/2019 8:03:48 AM      Labs Reviewed  BASIC METABOLIC PANEL - Abnormal; Notable for the following components:      Result Value   BUN <5 (*)     Calcium 8.7 (*)    All other components within normal limits  CBC - Abnormal; Notable for the following components:   RBC 3.05 (*)    Hemoglobin 7.1 (*)    HCT 23.6 (*)    MCV 77.4 (*)    MCH 23.3 (*)    RDW 17.2 (*)    nRBC 0.6 (*)    All other components within normal limits  D-DIMER, QUANTITATIVE (NOT AT [facility]) - Abnormal; Notable for the following components:   D-Dimer, Quant 7.55 (*)    All other components within normal limits  HEPATIC FUNCTION PANEL - Abnormal; Notable for the following components:   Total Protein 6.0 (*)    Albumin 2.7 (*)    All other components within normal limits  TROPONIN I (HIGH SENSITIVITY) - Abnormal; Notable for the following components:   Troponin I (High Sensitivity) 66 (*)    All other components within normal limits  SARS CORONAVIRUS 2 (TAT 6-24 HRS)  URINALYSIS, ROUTINE W REFLEX MICROSCOPIC  TROPONIN I (HIGH SENSITIVITY)    DG Chest 2 View  Final Result    CT ANGIO CHEST PE W OR WO CONTRAST    (Results Pending)    Medications  sodium chloride flush (NS) 0.9 % injection 3 mL (has no administration in time range)  labetalol (NORMODYNE) injection 10 mg (10 mg Intravenous Given 09/14/19 0926)  iohexol (OMNIPAQUE) 350 MG/ML injection 100 mL (75 mLs Intravenous Contrast Given 09/14/19 1031)     Procedures Critical Care Critical Care Documentation Critical care time provided by me (excluding procedures): 33 minutes  Condition necessitating critical care: Acute pulmonary edema with hypoxia  Components of critical care management: reviewing of prior records, laboratory and imaging interpretation, frequent re-examination and reassessment of vital signs, administration of IV labetalol, discussion with consulting services.    ED Course and Medical Decision Making  I have reviewed the triage vital signs and the nursing notes.  Pertinent labs & imaging results that were available during my care of the patient were reviewed by me and considered  in my medical decision making (see below for details).  C-section 2 weeks ago at [facility], patient reports that she required 2 units of blood during this hospitalization.  Due to this recent hospitalization and transfusion she is at increased risk for pulmonary embolism.  She is tachycardic, she is requiring 1 or 2 L of nasal cannula to maintain her O2 saturations above 94%.  Will need CTA to exclude.  Also considering cardiomyopathy related to pregnancy, labs pending.  Clinical Course as of Sep 13 1042  Tue Sep 14, 2019  6045 Chest x-ray  demonstrates diffuse interstitial pattern, favoring edema over infection.  This raises the concern for cardiomyopathy of pregnancy.  Patient is also hypertensive at 142/92.  She does endorse a history of gestational hypertension she denied any issues with eclampsia or preeclampsia during her pregnancy but now also considering postpartum preeclampsia.  Will repeat blood pressure, add on hepatic function and urine to check for protein.   [MB]  [id number] Awaiting callback from OB/GYN.  Patient's repeat blood pressures continued to be high.  Given IV labetalol.  Patient is without platelet change, no elevated hepatic function labs.  CT is pending.   [MB]    Clinical Course User Index [MB] Sabas Sous, MD    Case discussed with OB/GYN on call Dr. Dorma Russell, who recommends transfer to MAU for further management.  Elmer Sow. Pilar Plate, MD [facility] Emergency Medicine [facility] [email or web address]  Final Clinical Impressions(s) / ED Diagnoses     ICD-10-CM   1. Shortness of breath  R06.02   2. Hypoxia  R09.02   3. Acute pulmonary edema (HCC)  J81.0   4. S/P cesarean section  Z98.891   5. Elevated troponin  R79.89     ED Discharge Orders    None      Discharge Instructions Discussed with and Provided to Patient: Discharge Instructions   None       Sabas Sous, MD 09/14/19 1044

## 2019-09-14 NOTE — ED Notes (Signed)
Patient transported to CT 

## 2019-09-14 NOTE — H&P (Addendum)
OBSTETRIC ADMISSION HISTORY AND PHYSICAL  Anna Welch is a 18 y.o. female G26P1011 with known CHTN s/p CS 1 week ago presenting with SOB. SOB started when she woke this am. Denies CP. Reports generalized HA since waking. Denies visual disturbances and epigastric pain. Transferred from ED, chest CT negative.  Prenatal History/Complications: - CHTN - obesity - anemia  Past Medical History: Past Medical History:  Diagnosis Date  . Acid reflux     Past Surgical History: Past Surgical History:  Procedure Laterality Date  . MULTIPLE TOOTH EXTRACTIONS    . TYMPANOSTOMY TUBE PLACEMENT      Obstetrical History: OB History as of 09/08/2019    Gravida  2   Para      Term      Preterm      AB  1   Living        SAB  1   TAB      Ectopic      Multiple      Live Births              Social History: Social History   Socioeconomic History  . Marital status: Single    Spouse name: Not on file  . Number of children: Not on file  . Years of education: Not on file  . Highest education level: Not on file  Occupational History  . Not on file  Social Needs  . Financial resource strain: Not on file  . Food insecurity    Worry: Not on file    Inability: Not on file  . Transportation needs    Medical: Not on file    Non-medical: Not on file  Tobacco Use  . Smoking status: Never Smoker  Substance and Sexual Activity  . Alcohol use: Not Currently  . Drug use: Not Currently  . Sexual activity: Yes    Birth control/protection: None    Comment: last IC-couple days  Lifestyle  . Physical activity    Days per week: Not on file    Minutes per session: Not on file  . Stress: Not on file  Relationships  . Social Herbalist on phone: Not on file    Gets together: Not on file    Attends religious service: Not on file    Active member of club or organization: Not on file    Attends meetings of clubs or organizations: Not on file    Relationship status:  Not on file  Other Topics Concern  . Not on file  Social History Narrative  . Not on file    Family History: History reviewed. No pertinent family history.  Allergies: Allergies  Allergen Reactions  . Nitrofurantoin Rash    Medications Prior to Admission  Medication Sig Dispense Refill Last Dose  . DOK 100 MG capsule Take 100 mg by mouth 2 (two) times daily.   09/13/2019 at Unknown time  . ferrous sulfate (FERROUSUL) 325 (65 FE) MG tablet Take 1 tablet (325 mg total) by mouth 2 (two) times daily. 60 tablet 1 09/13/2019 at Unknown time  . HYDROcodone-acetaminophen (NORCO/VICODIN) 5-325 MG tablet Take 1 tablet by mouth every 8 (eight) hours as needed.   09/13/2019 at prn  . ibuprofen (ADVIL) 800 MG tablet Take 800 mg by mouth every 8 (eight) hours as needed for mild pain or moderate pain.   09/13/2019 at Unknown time  . AMBULATORY NON FORMULARY MEDICATION 1 Device by Other route once a week. Blood Pressure  Cuff/ Medium Monitored Regularly at home ICD 10: O09.90 1 kit 0   . aspirin 81 MG chewable tablet Chew 1 tablet (81 mg total) by mouth daily. (Patient not taking: Reported on 09/14/2019) 60 tablet 2 Not Taking at Unknown time  . cephALEXin (KEFLEX) 500 MG capsule Take 1 capsule (500 mg total) by mouth 4 (four) times daily. (Patient not taking: Reported on 09/14/2019) 28 capsule 2 Not Taking at Unknown time  . ferrous fumarate (HEMOCYTE - 106 MG FE) 325 (106 Fe) MG TABS tablet Take 1 tablet (106 mg of iron total) by mouth daily. (Patient not taking: Reported on 09/14/2019) 30 each 3 Not Taking at Unknown time  . terconazole (TERAZOL 7) 0.4 % vaginal cream Place 1 applicator vaginally at bedtime. (Patient not taking: Reported on 09/14/2019) 45 g 0 Not Taking at Unknown time     Review of Systems   All systems reviewed and negative except as stated in HPI  PE: Blood pressure (!) 160/87, pulse 98, temperature 99 F (37.2 C), temperature source Oral, resp. rate (!) 23, last menstrual period  11/27/2018, SpO2 99 %, unknown if currently breastfeeding. General appearance: alert, cooperative and no distress Lungs: unlabored, CTAB, tachypneic  Heart: RRR Abdomen: soft, non-tender; incision well approximated w/glue Extremities: Homans sign is negative, no sign of DVT, 1+ leg edema  Results for orders placed or performed during the hospital encounter of 09/14/19 (from the past 24 hour(s))  Basic metabolic panel   Collection Time: 09/14/19  7:01 AM  Result Value Ref Range   Sodium 142 135 - 145 mmol/L   Potassium 4.0 3.5 - 5.1 mmol/L   Chloride 110 98 - 111 mmol/L   CO2 24 22 - 32 mmol/L   Glucose, Bld 92 70 - 99 mg/dL   BUN <5 (L) 6 - 20 mg/dL   Creatinine, Ser 0.65 0.44 - 1.00 mg/dL   Calcium 8.7 (L) 8.9 - 10.3 mg/dL   GFR calc non Af Amer >60 >60 mL/min   GFR calc Af Amer >60 >60 mL/min   Anion gap 8 5 - 15  CBC   Collection Time: 09/14/19  7:01 AM  Result Value Ref Range   WBC 7.8 4.0 - 10.5 K/uL   RBC 3.05 (L) 3.87 - 5.11 MIL/uL   Hemoglobin 7.1 (L) 12.0 - 15.0 g/dL   HCT 23.6 (L) 36.0 - 46.0 %   MCV 77.4 (L) 80.0 - 100.0 fL   MCH 23.3 (L) 26.0 - 34.0 pg   MCHC 30.1 30.0 - 36.0 g/dL   RDW 17.2 (H) 11.5 - 15.5 %   Platelets 240 150 - 400 K/uL   nRBC 0.6 (H) 0.0 - 0.2 %  Hepatic function panel   Collection Time: 09/14/19  7:01 AM  Result Value Ref Range   Total Protein 6.0 (L) 6.5 - 8.1 g/dL   Albumin 2.7 (L) 3.5 - 5.0 g/dL   AST 21 15 - 41 U/L   ALT 17 0 - 44 U/L   Alkaline Phosphatase 82 38 - 126 U/L   Total Bilirubin 0.4 0.3 - 1.2 mg/dL   Bilirubin, Direct <0.1 0.0 - 0.2 mg/dL   Indirect Bilirubin NOT CALCULATED 0.3 - 0.9 mg/dL  Troponin I (High Sensitivity)   Collection Time: 09/14/19  7:01 AM  Result Value Ref Range   Troponin I (High Sensitivity) 66 (H) <18 ng/L  D-dimer, quantitative (not at [facility])   Collection Time: 09/14/19  8:08 AM  Result Value Ref Range   D-Dimer,  Quant 7.55 (H) 0.00 - 0.50 ug/mL-FEU  Troponin I (High Sensitivity)   Collection  Time: 09/14/19 10:08 AM  Result Value Ref Range   Troponin I (High Sensitivity) 50 (H) <18 ng/L    Patient Active Problem List   Diagnosis Date Noted  . Pre-eclampsia superimposed on chronic hypertension, postpartum 09/14/2019  . Anemia in pregnancy 06/02/2019  . Supervision of high risk pregnancy, antepartum 05/19/2019  . Preexisting hypertension complicating pregnancy, antepartum 05/19/2019  . UTI (urinary tract infection) during pregnancy 05/19/2019  . Otorrhea of left ear 05/18/2019   Dg Chest 2 View  Result Date: 09/14/2019 CLINICAL DATA:  Shortness of breath and cough. Status post C-section delivery 6 days ago. EXAM: CHEST - 2 VIEW COMPARISON:  None. FINDINGS: The heart is mildly enlarged. Diffuse interstitial and airspace pattern is present bilaterally. There is no significant consolidation. No effusions are present. IMPRESSION: Borderline enlargement of the heart and a mild diffuse interstitial pattern consistent with edema. This may represent postpartum cardiogenic edema. Differential diagnosis includes pulmonary embolism and infection. Electronically Signed   By: San Morelle M.D.   On: 09/14/2019 08:23   Ct Angio Chest Pe W Or Wo Contrast  Result Date: 09/14/2019 CLINICAL DATA:  Pt is 5 days Post-Partum; Sudden onset of SOB last night. Pt denies any chest pain; EXAM: CT ANGIOGRAPHY CHEST WITH CONTRAST TECHNIQUE: Multidetector CT imaging of the chest was performed using the standard protocol during bolus administration of intravenous contrast. Multiplanar CT image reconstructions and MIPs were obtained to evaluate the vascular anatomy. CONTRAST:  9m OMNIPAQUE IOHEXOL 350 MG/ML SOLN COMPARISON:  None FINDINGS: Cardiovascular: Heart size upper limits normal. Trace pericardial fluid. Fair contrast opacification of pulmonary artery and its branches. Contrast rate and timing is good; her hyperdynamic postpartum state and body habitus result in limited pulmonary arterial branch  opacification. No convincing large central pulmonary emboli. Adequate contrast opacification of the thoracic aorta with no evidence of dissection, aneurysm, or stenosis. There is classic 3-vessel brachiocephalic arch anatomy without proximal stenosis. No significant atheromatous change. Mediastinum/Nodes: Residual thymic tissue. No hilar or mediastinal adenopathy. Lungs/Pleura: No pleural effusion. No pneumothorax. Patchy ill-defined scattered somewhat nodular airspace opacities throughout both lungs, right worse than left, with peripheral sparing, involving bases more than apices. Upper Abdomen: No acute findings. Musculoskeletal: No chest wall abnormality. No acute or significant osseous findings. Review of the MIP images confirms the above findings. IMPRESSION: 1. Patchy ill-defined nodular airspace opacities throughout both lungs, right worse than left. While nonspecific, primary considerations include atypical pulmonary edema, infectious including viral, and inflammatory etiologies. 2. Negative for acute PE (with caveats as above) or thoracic aortic dissection. Electronically Signed   By: DLucrezia EuropeM.D.   On: 09/14/2019 11:03   Assessment: CHTN with severe superimposed PEC, postpartum Pulmonary edema   [facility] consult with Dr. ERip HarbourMgSO4 Lasix Antihypertensives ECHO Mngt per MD  MJulianne Handler CNM  09/14/2019, 12:17 PM    OB Attending Pt seen and examined. [facility] [place] [facility] C[facility] vs GHTN during recent pregnancy. No BP meds per pt. Had c section d/t FTP. Was transfuse this past Friday d/t anemia. Discharge this past Sat Developed SOB this morning W/U compatible with SPEC with pulmonary edema CTA negative for PE Will admitted for magnesium, lasix for diuresis, check ECHO POC reviewed with pt   MArlina Robes MD

## 2019-09-14 NOTE — ED Triage Notes (Signed)
Pt arrived by [facility] for cough and sob that started last night pt had recent c-section on 9/16, had blood transfusions prior to discharge. Denies increase in swelling to her legs, no cardiac hx. Reports blood tinged frothy sputum.

## 2019-09-14 NOTE — Progress Notes (Signed)
  Echocardiogram 2D Echocardiogram has been performed.  Anna Welch M 09/14/2019, 1:07 PM

## 2019-09-15 DIAGNOSIS — O8600 Infection of obstetric surgical wound, unspecified: Secondary | ICD-10-CM | POA: Diagnosis present

## 2019-09-15 MED ORDER — LISINOPRIL 10 MG PO TABS
10.0000 mg | ORAL_TABLET | Freq: Once | ORAL | Status: AC
  Administered 2019-09-15: 10 mg via ORAL
  Filled 2019-09-15: qty 1

## 2019-09-15 MED ORDER — PRENATAL MULTIVITAMIN CH
1.0000 | ORAL_TABLET | Freq: Every day | ORAL | Status: DC
  Administered 2019-09-15 – 2019-09-16 (×2): 1 via ORAL
  Filled 2019-09-15 (×2): qty 1

## 2019-09-15 MED ORDER — SODIUM CHLORIDE 0.9 % IV SOLN
510.0000 mg | Freq: Once | INTRAVENOUS | Status: AC
  Administered 2019-09-15: 510 mg via INTRAVENOUS
  Filled 2019-09-15: qty 17

## 2019-09-15 MED ORDER — ACETAMINOPHEN 325 MG PO TABS
650.0000 mg | ORAL_TABLET | ORAL | Status: DC | PRN
  Administered 2019-09-15: 650 mg via ORAL
  Filled 2019-09-15: qty 2

## 2019-09-15 MED ORDER — SULFAMETHOXAZOLE-TRIMETHOPRIM 800-160 MG PO TABS
1.0000 | ORAL_TABLET | Freq: Two times a day (BID) | ORAL | Status: DC
  Administered 2019-09-15 – 2019-09-17 (×4): 1 via ORAL
  Filled 2019-09-15 (×5): qty 1

## 2019-09-15 MED ORDER — LISINOPRIL 10 MG PO TABS
20.0000 mg | ORAL_TABLET | Freq: Every day | ORAL | Status: DC
  Administered 2019-09-16 – 2019-09-17 (×2): 20 mg via ORAL
  Filled 2019-09-15 (×2): qty 2

## 2019-09-15 NOTE — Progress Notes (Signed)
Dr Harolyn Rutherford called by this RN d/t pt having yellow discharge with foul smell from C/S incision. Incision is not red or swollen and pt does not have temperature. Dr A with assess when rounding on pt.

## 2019-09-15 NOTE — Progress Notes (Signed)
Patient ID: Anna Welch, female   DOB: [birth year], 18 y.o.   MRN: [id number] HD # 1 CHTN with [place] and pulmonary edema  Pt reports feeling much better this morning. She denies SOB presently.'Off O2 now. Denies HA or visual changes. Tolerating diet. Up to restroom without problems.  PE AF  BP 155/103  Good UOP O2 Sat 97% RA Lungs clear Heart RRR Abd soft + BS incision healing well Ext SCD's  A/P CHTN with SIPEC and pulmonary edema        POD # 7 LTCS  Improved since admission. Magnesium will come off today @ noon. Will continue to monitor BP. Repeat labs in AM. Iron and PNV for anemia

## 2019-09-15 NOTE — Progress Notes (Signed)
Faculty Practice OB/GYN Attending Note  Subjective:  Called to evaluate patient with increased malodorous drainage from incision. Denies any fevers, does have incisional pain.    Admitted on 09/14/2019 for Pre-eclampsia superimposed on chronic hypertension, postpartum.    Objective:  Blood pressure (!) 153/108, pulse (!) 105, temperature 98.4 F (36.9 C), temperature source Oral, resp. rate 18, height 5\' 8"  (1.727 m), weight (!) 141.8 kg, last menstrual period 11/27/2018, SpO2 99 %, unknown if currently breastfeeding. Vitals:   09/15/19 0855 09/15/19 1134 09/15/19 1534 09/15/19 1932  BP:  (!) 159/99 (!) 150/92 (!) 153/108  Pulse:  (!) 103 (!) 107 (!) 105  Resp:  20 18 18   Temp:  98.4 F (36.9 C) 99.1 F (37.3 C) 98.4 F (36.9 C)  TempSrc:  Oral Oral Oral  SpO2: 98% 99% 98% 99%  Weight:      Height:       Gen: NAD HENT: Normocephalic, atraumatic Lungs: Normal respiratory effort Heart: Regular rate noted Abdomen: soft, obese, edema of pannus, incision noted with dried adhesive material (Dermabond) on it, expressing serosanguinous malodorous fluid. Moderate tenderness to palpation, blanching erythema noted around incision. Ext: Normal ROM, SCDs on  Assessment & Plan:  18 y.o. G2P1011 at admitted for postpartum CHTN with SI severe PEC, now with incisional cellulitis.  Off magnesium sulfate, on Lisinopril 10 mg daily. - Bactrim DS ordered for cellulitis - Lisinopril 10 mg given for BP control, increased daily dosage to 20 mg starting tomorrow Continue close observation.   Verita Schneiders, MD, [place] for [employer], [place]

## 2019-09-16 LAB — CBC
HCT: 22.6 % — ABNORMAL LOW (ref 36.0–46.0)
Hemoglobin: 7.1 g/dL — ABNORMAL LOW (ref 12.0–15.0)
MCH: 23.5 pg — ABNORMAL LOW (ref 26.0–34.0)
MCHC: 31.4 g/dL (ref 30.0–36.0)
MCV: 74.8 fL — ABNORMAL LOW (ref 80.0–100.0)
Platelets: 276 10*3/uL (ref 150–400)
RBC: 3.02 MIL/uL — ABNORMAL LOW (ref 3.87–5.11)
RDW: 17.2 % — ABNORMAL HIGH (ref 11.5–15.5)
WBC: 7 10*3/uL (ref 4.0–10.5)
nRBC: 1 % — ABNORMAL HIGH (ref 0.0–0.2)

## 2019-09-16 LAB — BASIC METABOLIC PANEL
Anion gap: 9 (ref 5–15)
BUN: <5 mg/dL — ABNORMAL LOW (ref 6–20)
CO2: 24 mmol/L (ref 22–32)
Calcium: 8.6 mg/dL — ABNORMAL LOW (ref 8.9–10.3)
Chloride: 106 mmol/L (ref 98–111)
Creatinine, Ser: 0.71 mg/dL (ref 0.44–1.00)
GFR calc Af Amer: >60 mL/min (ref >60)
GFR calc non Af Amer: >60 mL/min (ref >60)
Glucose, Bld: 89 mg/dL (ref 70–99)
Potassium: 3.7 mmol/L (ref 3.5–5.1)
Sodium: 139 mmol/L (ref 135–145)

## 2019-09-16 NOTE — Progress Notes (Signed)
Faculty Practice OB/GYN Attending Note  Subjective:  No sentinel events since last night.  Patient denies any headaches, visual symptoms, RUQ/epigastric pain or other concerning symptoms.  Admitted on 09/14/2019 for Pre-eclampsia superimposed on chronic hypertension, postpartum.    Objective:  Blood pressure 119/83, pulse 94, temperature 98.7 F (37.1 C), temperature source Oral, resp. rate 18, height 5\' 8"  (1.727 m), weight (!) 141.8 kg, last menstrual period 11/27/2018, SpO2 100 %, unknown if currently breastfeeding. Vitals:   09/15/19 1534 09/15/19 1932 09/15/19 2308 09/16/19 0614  BP: (!) 150/92 (!) 153/108 (!) 156/97 119/83  Pulse: (!) 107 (!) 105 (!) 112 94  Resp: 18 18 20 18   Temp: 99.1 F (37.3 C) 98.4 F (36.9 C) 98.8 F (37.1 C) 98.7 F (37.1 C)  TempSrc: Oral Oral Oral Oral  SpO2: 98% 99% 100% 100%  Weight:      Height:       Gen: NAD HENT: Normocephalic, atraumatic Lungs: Normal respiratory effort Heart: Regular rate noted Abdomen: soft, obese, edema of pannus, incision noted with dried adhesive material (Dermabond) on it, some serosanguinous malodorous fluid. Ext: Normal ROM, SCDs on  Assessment & Plan:  18 y.o. [id number] HD#3 admitted for postpartum CHTN with SI severe PEC, now with incisional cellulitis.  Off magnesium sulfate, on Lisinopril 20 mg daily. - Normal BP this morning, will monitor rest of day. Likely discharge later today or tomorrow if remains stable. - Bactrim DS ordered for cellulitis -Pain medication as needed Continue close observation.   Verita Schneiders, MD, [place] for [employer], [place]

## 2019-09-17 MED ORDER — SULFAMETHOXAZOLE-TRIMETHOPRIM 800-160 MG PO TABS: 1.0000 | ORAL_TABLET | Freq: Two times a day (BID) | ORAL | 0 refills | Status: DC

## 2019-09-17 MED ORDER — LISINOPRIL 20 MG PO TABS: 20.0000 mg | ORAL_TABLET | Freq: Every day | ORAL | 1 refills | Status: DC

## 2019-09-17 MED FILL — LISINOPRIL 20 MG TABLET: 20 | 30 days supply | Qty: 30 | Fill #0

## 2019-09-17 MED FILL — SULFAMETHOXAZOLE-TMP DS TAB: 800-160 | 10 days supply | Qty: 20 | Fill #0

## 2019-09-17 NOTE — Plan of Care (Signed)
  Problem: Education: Goal: Knowledge of General Education information will improve Description: Including pain rating scale, medication(s)/side effects and non-pharmacologic comfort measures Outcome: Completed/Met   Problem: Clinical Measurements: Goal: Ability to maintain clinical measurements within normal limits will improve Outcome: Completed/Met Goal: Will remain free from infection Outcome: Completed/Met Goal: Diagnostic test results will improve Outcome: Completed/Met Goal: Respiratory complications will improve Outcome: Completed/Met Goal: Cardiovascular complication will be avoided Outcome: Completed/Met   Problem: Activity: Goal: Risk for activity intolerance will decrease Outcome: Completed/Met   Problem: Nutrition: Goal: Adequate nutrition will be maintained Outcome: Completed/Met   Problem: Coping: Goal: Level of anxiety will decrease Outcome: Completed/Met   Problem: Elimination: Goal: Will not experience complications related to bowel motility Outcome: Completed/Met Goal: Will not experience complications related to urinary retention Outcome: Completed/Met   Problem: Pain Managment: Goal: General experience of comfort will improve Outcome: Completed/Met   Problem: Safety: Goal: Ability to remain free from injury will improve Outcome: Completed/Met   Problem: Skin Integrity: Goal: Risk for impaired skin integrity will decrease Outcome: Completed/Met   Problem: Education: Goal: Knowledge of disease or condition will improve Outcome: Completed/Met Goal: Knowledge of the prescribed therapeutic regimen will improve Outcome: Completed/Met   Problem: Fluid Volume: Goal: Peripheral tissue perfusion will improve Outcome: Completed/Met   Problem: Clinical Measurements: Goal: Complications related to disease process, condition or treatment will be avoided or minimized Outcome: Completed/Met   Problem: Education: Goal: Knowledge of General Education  information will improve Description: Including pain rating scale, medication(s)/side effects and non-pharmacologic comfort measures Outcome: Completed/Met   Problem: Clinical Measurements: Goal: Ability to maintain clinical measurements within normal limits will improve Outcome: Completed/Met Goal: Will remain free from infection Outcome: Completed/Met Goal: Diagnostic test results will improve Outcome: Completed/Met Goal: Respiratory complications will improve Outcome: Completed/Met Goal: Cardiovascular complication will be avoided Outcome: Completed/Met   Problem: Activity: Goal: Risk for activity intolerance will decrease Outcome: Completed/Met   Problem: Nutrition: Goal: Adequate nutrition will be maintained Outcome: Completed/Met   Problem: Coping: Goal: Level of anxiety will decrease Outcome: Completed/Met   Problem: Elimination: Goal: Will not experience complications related to bowel motility Outcome: Completed/Met Goal: Will not experience complications related to urinary retention Outcome: Completed/Met   Problem: Pain Managment: Goal: General experience of comfort will improve Outcome: Completed/Met   Problem: Safety: Goal: Ability to remain free from injury will improve Outcome: Completed/Met   Problem: Skin Integrity: Goal: Risk for impaired skin integrity will decrease Outcome: Completed/Met   Problem: Education: Goal: Knowledge of disease or condition will improve Outcome: Completed/Met Goal: Knowledge of the prescribed therapeutic regimen will improve Outcome: Completed/Met   Problem: Fluid Volume: Goal: Peripheral tissue perfusion will improve Outcome: Completed/Met   Problem: Clinical Measurements: Goal: Complications related to disease process, condition or treatment will be avoided or minimized Outcome: Completed/Met

## 2019-09-17 NOTE — Discharge Instructions (Signed)
Postpartum Hypertension Postpartum hypertension is high blood pressure that remains higher than normal after childbirth. You may not realize that you have postpartum hypertension if your blood pressure is not being checked regularly. In most cases, postpartum hypertension will go away on its own, usually within a week of delivery. However, for some women, medical treatment is required to prevent serious complications, such as seizures or stroke. What are the causes? This condition may be caused by one or more of the following:  Hypertension that existed before pregnancy (chronic hypertension).  Hypertension that comes on as a result of pregnancy (gestational hypertension).  Hypertensive disorders during pregnancy (preeclampsia) or seizures in women who have high blood pressure during pregnancy (eclampsia).  A condition in which the liver, platelets, and red blood cells are damaged during pregnancy (HELLP syndrome).  A condition in which the thyroid produces too much hormones (hyperthyroidism).  Other rare problems of the nerves (neurological disorders) or blood disorders. In some cases, the cause may not be known. What increases the risk? The following factors may make you more likely to develop this condition:  Chronic hypertension. In some cases, this may not have been diagnosed before pregnancy.  Obesity.  Type 2 diabetes.  Kidney disease.  History of preeclampsia or eclampsia.  Other medical conditions that change the level of hormones in the body (hormonal imbalance). What are the signs or symptoms? As with all types of hypertension, postpartum hypertension may not have any symptoms. Depending on how high your blood pressure is, you may experience:  Headaches. These may be mild, moderate, or severe. They may also be steady, constant, or sudden in onset (thunderclap headache).  Changes in your ability to see (visual changes).  Dizziness.  Shortness of breath.  Swelling  of your hands, feet, lower legs, or face. In some cases, you may have swelling in more than one of these locations.  Heart palpitations or a racing heartbeat.  Difficulty breathing while lying down.  Decrease in the amount of urine that you pass. Other rare signs and symptoms may include:  Sweating more than usual. This lasts longer than a few days after delivery.  Chest pain.  Sudden dizziness when you get up from sitting or lying down.  Seizures.  Nausea or vomiting.  Abdominal pain. How is this diagnosed? This condition may be diagnosed based on the results of a physical exam, blood pressure measurements, and blood and urine tests. You may also have other tests, such as a CT scan or an MRI, to check for other problems of postpartum hypertension. How is this treated? If blood pressure is high enough to require treatment, your options may include:  Medicines to reduce blood pressure (antihypertensives). Tell your health care provider if you are breastfeeding or if you plan to breastfeed. There are many antihypertensive medicines that are safe to take while breastfeeding.  Stopping medicines that may be causing hypertension.  Treating medical conditions that are causing hypertension.  Treating the complications of hypertension, such as seizures, stroke, or kidney problems. Your health care provider will also continue to monitor your blood pressure closely until it is within a safe range for you. Follow these instructions at home:  Take over-the-counter and prescription medicines only as told by your health care provider.  Return to your normal activities as told by your health care provider. Ask your health care provider what activities are safe for you.  Do not use any products that contain nicotine or tobacco, such as cigarettes and e-cigarettes. If   you need help quitting, ask your health care provider.  Keep all follow-up visits as told by your health care provider. This  is important. Contact a health care provider if:  Your symptoms get worse.  You have new symptoms, such as: ? A headache that does not get better. ? Dizziness. ? Visual changes. Get help right away if:  You suddenly develop swelling in your hands, ankles, or face.  You have sudden, rapid weight gain.  You develop difficulty breathing, chest pain, racing heartbeat, or heart palpitations.  You develop severe pain in your abdomen.  You have any symptoms of a stroke. "BE FAST" is an easy way to remember the main warning signs of a stroke: ? B - Balance. Signs are dizziness, sudden trouble walking, or loss of balance. ? E - Eyes. Signs are trouble seeing or a sudden change in vision. ? F - Face. Signs are sudden weakness or numbness of the face, or the face or eyelid drooping on one side. ? A - Arms. Signs are weakness or numbness in an arm. This happens suddenly and usually on one side of the body. ? S - Speech. Signs are sudden trouble speaking, slurred speech, or trouble understanding what people say. ? T - Time. Time to call emergency services. Write down what time symptoms started.  You have other signs of a stroke, such as: ? A sudden, severe headache with no known cause. ? Nausea or vomiting. ? Seizure. These symptoms may represent a serious problem that is an emergency. Do not wait to see if the symptoms will go away. Get medical help right away. Call your local emergency services (911 in the [place] Do not drive yourself to the hospital. Summary  Postpartum hypertension is high blood pressure that remains higher than normal after childbirth.  In most cases, postpartum hypertension will go away on its own, usually within a week of delivery.  For some women, medical treatment is required to prevent serious complications, such as seizures or stroke. This information is not intended to replace advice given to you by your health care provider. Make sure you discuss any questions  you have with your health care provider. Document Released: 08/12/2014 Document Revised: 01/15/2019 Document Reviewed: 09/29/2017 Elsevier Patient Education  2020 [employer]. Cesarean Delivery, Care After This sheet gives you information about how to care for yourself after your procedure. Your health care provider may also give you more specific instructions. If you have problems or questions, contact your health care provider. What can I expect after the procedure? After the procedure, it is common to have:  A small amount of blood or clear fluid coming from the incision.  Some redness, swelling, and pain in your incision area.  Some abdominal pain and soreness.  Vaginal bleeding (lochia). Even though you did not have a vaginal delivery, you will still have vaginal bleeding and discharge.  Pelvic cramps.  Fatigue. You may have pain, swelling, and discomfort in the tissue between your vagina and your anus (perineum) if:  Your C-section was unplanned, and you were allowed to labor and push.  An incision was made in the area (episiotomy) or the tissue tore during attempted vaginal delivery. Follow these instructions at home: Incision care   Follow instructions from your health care provider about how to take care of your incision. Make sure you: ? Wash your hands with soap and water before you change your bandage (dressing). If soap and water are not available, use hand sanitizer. ?  If you have a dressing, change it or remove it as told by your health care provider. ? Leave stitches (sutures), skin staples, skin glue, or adhesive strips in place. These skin closures may need to stay in place for 2 weeks or longer. If adhesive strip edges start to loosen and curl up, you may trim the loose edges. Do not remove adhesive strips completely unless your health care provider tells you to do that.  Check your incision area every day for signs of infection. Check for: ? More redness,  swelling, or pain. ? More fluid or blood. ? Warmth. ? Pus or a bad smell.  Do not take baths, swim, or use a hot tub until your health care provider says it's okay. Ask your health care provider if you can take showers.  When you cough or sneeze, hug a pillow. This helps with pain and decreases the chance of your incision opening up (dehiscing). Do this until your incision heals. Medicines  Take over-the-counter and prescription medicines only as told by your health care provider.  If you were prescribed an antibiotic medicine, take it as told by your health care provider. Do not stop taking the antibiotic even if you start to feel better.  Do not drive or use heavy machinery while taking prescription pain medicine. Lifestyle  Do not drink alcohol. This is especially important if you are breastfeeding or taking pain medicine.  Do not use any products that contain nicotine or tobacco, such as cigarettes, e-cigarettes, and chewing tobacco. If you need help quitting, ask your health care provider. Eating and drinking  Drink at least 8 eight-ounce glasses of water every day unless told not to by your health care provider. If you breastfeed, you may need to drink even more water.  Eat high-fiber foods every day. These foods may help prevent or relieve constipation. High-fiber foods include: ? Whole grain cereals and breads. ? Brown rice. ? Beans. ? Fresh fruits and vegetables. Activity   If possible, have someone help you care for your baby and help with household activities for at least a few days after you leave the hospital.  Return to your normal activities as told by your health care provider. Ask your health care provider what activities are safe for you.  Rest as much as possible. Try to rest or take a nap while your baby is sleeping.  Do not lift anything that is heavier than 10 lbs (4.5 kg), or the limit that you were told, until your health care provider says that it is  safe.  Talk with your health care provider about when you can engage in sexual activity. This may depend on your: ? Risk of infection. ? How fast you heal. ? Comfort and desire to engage in sexual activity. General instructions  Do not use tampons or douches until your health care provider approves.  Wear loose, comfortable clothing and a supportive and well-fitting bra.  Keep your perineum clean and dry. Wipe from front to back when you use the toilet.  If you pass a blood clot, save it and call your health care provider to discuss. Do not flush blood clots down the toilet before you get instructions from your health care provider.  Keep all follow-up visits for you and your baby as told by your health care provider. This is important. Contact a health care provider if:  You have: ? A fever. ? Bad-smelling vaginal discharge. ? Pus or a bad smell coming from your  incision. ? Difficulty or pain when urinating. ? A sudden increase or decrease in the frequency of your bowel movements. ? More redness, swelling, or pain around your incision. ? More fluid or blood coming from your incision. ? A rash. ? Nausea. ? Little or no interest in activities you used to enjoy. ? Questions about caring for yourself or your baby.  Your incision feels warm to the touch.  Your breasts turn red or become painful or hard.  You feel unusually sad or worried.  You vomit.  You pass a blood clot from your vagina.  You urinate more than usual.  You are dizzy or light-headed. Get help right away if:  You have: ? Pain that does not go away or get better with medicine. ? Chest pain. ? Difficulty breathing. ? Blurred vision or spots in your vision. ? Thoughts about hurting yourself or your baby. ? New pain in your abdomen or in one of your legs. ? A severe headache.  You faint.  You bleed from your vagina so much that you fill more than one sanitary pad in one hour. Bleeding should not be  heavier than your heaviest period. Summary  After the procedure, it is common to have pain at your incision site, abdominal cramping, and slight bleeding from your vagina.  Check your incision area every day for signs of infection.  Tell your health care provider about any unusual symptoms.  Keep all follow-up visits for you and your baby as told by your health care provider. This information is not intended to replace advice given to you by your health care provider. Make sure you discuss any questions you have with your health care provider. Document Released: 08/31/2002 Document Revised: 06/17/2018 Document Reviewed: 06/17/2018 Elsevier Patient Education  2020 [employer].

## 2019-09-17 NOTE — Progress Notes (Signed)
Patient discharged with printed instructions. Pt verbalized an understanding. No concerns noted. Lyfe Reihl L Kianna Billet, RN 

## 2019-09-17 NOTE — Discharge Summary (Signed)
Postpartum Discharge Summary  Date of Service updated     Patient Name: Anna Welch DOB: 2001-01-18 MRN: [id number]  Date of admission: 09/14/2019 Delivering Provider: This patient has no babies on file.  Date of discharge: 09/17/2019  Admitting diagnosis: Shortness of breath [R06.02] Acute pulmonary edema ([place]) [J81.0] Hypoxia [R09.02] Elevated troponin [R79.89] S/P cesarean section [N05.397] Intrauterine pregnancy: [redacted]w[redacted]d     Secondary diagnosis:  Principal Problem:   Pre-eclampsia superimposed on chronic hypertension, postpartum Active Problems:   Cesarean wound infection  Additional problems: NA     Discharge diagnosis: SAA                                                                                                Post partum procedures: ECHO   Hospital course: Ms Klimas was admitted POD # 6 from LTCS complicated by [facility] and anemia. She received prenatal care in [place]. Present to [place] on DOA with above complaints. Work up was negative for PE but CHTN with Jeannette and pulmonary noted.  She received magnesium x 24 hours, diuresised with lasix and started on Linsinoprl.  Cardiac ECHO reveal normal EF. She responded well to treatment. BP medications were adjusted for better control. BP at time of discharge 150's/80's and had no further s/sx of SIPEC. She was noted on HD # 2 to have some incisional drainage and started on Bacterium.At time of discharge she reported no incisional pain and improvement of the discharge She progressed to ambulating, voiding, tolerating diet and good pain control. Amendable for discharge home. Discharge instructions, medications and follow up were reviewed with pt. Pt verbalized understanding    Physical exam  Vitals:   09/16/19 1943 09/16/19 2210 09/17/19 0423 09/17/19 0836  BP: (!) 150/86 (!) 143/84 (!) 146/86 (!) 150/87  Pulse: 79 86 86 79  Resp: 20 20 17 18   Temp: 98.7 F (37.1 C) 98.5 F (36.9 C) 98.4 F (36.9 C) 98.6  F (37 C)  TempSrc:  Oral Oral Oral  SpO2: 99% 98% 99% 98%  Weight:   (!) 137 kg   Height:       General: alert Lochia: appropriate Uterine Fundus: firm Incision: healing min discharge DVT Evaluation: No evidence of DVT seen on physical exam. Labs: Lab Results  Component Value Date   WBC 7.0 09/16/2019   HGB 7.1 (L) 09/16/2019   HCT 22.6 (L) 09/16/2019   MCV 74.8 (L) 09/16/2019   PLT 276 09/16/2019   CMP Latest Ref Rng & Units 09/16/2019  Glucose 70 - 99 mg/dL 89  BUN 6 - 20 mg/dL <5(L)  Creatinine 0.44 - 1.00 mg/dL 0.71  Sodium 135 - 145 mmol/L 139  Potassium 3.5 - 5.1 mmol/L 3.7  Chloride 98 - 111 mmol/L 106  CO2 22 - 32 mmol/L 24  Calcium 8.9 - 10.3 mg/dL 8.6(L)  Total Protein 6.5 - 8.1 g/dL -  Total Bilirubin 0.3 - 1.2 mg/dL -  Alkaline Phos 38 - 126 U/L -  AST 15 - 41 U/L -  ALT 0 - 44 U/L -    Discharge instruction: per After  Visit Summary and "Baby and Me Booklet".  After visit meds:  Allergies as of 09/17/2019      Reactions   Nitrofurantoin Rash      Medication List    STOP taking these medications   aspirin 81 MG chewable tablet   cephALEXin 500 MG capsule Commonly known as: KEFLEX   DOK 100 MG capsule Generic drug: docusate sodium   ferrous sulfate 325 (65 FE) MG tablet Commonly known as: FerrouSul   terconazole 0.4 % vaginal cream Commonly known as: Terazol 7     TAKE these medications   AMBULATORY NON FORMULARY MEDICATION 1 Device by Other route once a week. Blood Pressure Cuff/ Medium Monitored Regularly at home ICD 10: O09.90   ferrous fumarate 325 (106 Fe) MG Tabs tablet Commonly known as: HEMOCYTE - 106 mg FE Take 1 tablet (106 mg of iron total) by mouth daily.   HYDROcodone-acetaminophen 5-325 MG tablet Commonly known as: NORCO/VICODIN Take 1 tablet by mouth every 8 (eight) hours as needed.   ibuprofen 800 MG tablet Commonly known as: ADVIL Take 800 mg by mouth every 8 (eight) hours as needed for mild pain or moderate  pain.   lisinopril 20 MG tablet Commonly known as: ZESTRIL Take 1 tablet (20 mg total) by mouth daily.   sulfamethoxazole-trimethoprim 800-160 MG tablet Commonly known as: BACTRIM DS Take 1 tablet by mouth every 12 (twelve) hours.            Discharge Care Instructions  (From admission, onward)         Start     Ordered   09/17/19 0000  Discharge wound care:    Comments: Clean wound twice a day with soap and water   09/17/19 0957          Diet: routine diet  Activity: Advance as tolerated. Pelvic rest for 6 weeks.   Outpatient follow up: 1 week for BP and incision check Follow up Appt:No future appointments. Follow up Visit: Follow-up Information    [facility]. Schedule an appointment as soon as possible for a visit in 1 week(s).   Specialty: Obstetrics and Gynecology Why: 1 week for BP and incision check 4 weeks for [facility] visit Contact information: [address], [address] [id number] [place] [id number] [phone]                09/17/2019 Hermina Staggers, MD

## 2019-09-23 ENCOUNTER — Ambulatory Visit: Payer: Medicaid Other

## 2021-07-06 ENCOUNTER — Encounter (HOSPITAL_COMMUNITY): Payer: Self-pay

## 2021-07-06 ENCOUNTER — Inpatient Hospital Stay (HOSPITAL_COMMUNITY)
Admission: AD | Admit: 2021-07-06 | Discharge: 2021-07-06 | Disposition: A | Discharge status: Home / Self Care | Payer: Medicaid Other | Attending: Obstetrics and Gynecology | Admitting: Obstetrics and Gynecology

## 2021-07-06 ENCOUNTER — Inpatient Hospital Stay (HOSPITAL_COMMUNITY): Payer: Medicaid Other

## 2021-07-06 DIAGNOSIS — Z3689 Encounter for other specified antenatal screening: Secondary | ICD-10-CM

## 2021-07-06 DIAGNOSIS — O99613 Diseases of the digestive system complicating pregnancy, third trimester: Secondary | ICD-10-CM | POA: Diagnosis not present

## 2021-07-06 DIAGNOSIS — Z79899 Other long term (current) drug therapy: Secondary | ICD-10-CM | POA: Insufficient documentation

## 2021-07-06 DIAGNOSIS — R1013 Epigastric pain: Secondary | ICD-10-CM | POA: Diagnosis not present

## 2021-07-06 DIAGNOSIS — O26893 Other specified pregnancy related conditions, third trimester: Secondary | ICD-10-CM

## 2021-07-06 DIAGNOSIS — R109 Unspecified abdominal pain: Secondary | ICD-10-CM | POA: Diagnosis not present

## 2021-07-06 DIAGNOSIS — Z3A34 34 weeks gestation of pregnancy: Secondary | ICD-10-CM | POA: Diagnosis not present

## 2021-07-06 DIAGNOSIS — K219 Gastro-esophageal reflux disease without esophagitis: Secondary | ICD-10-CM | POA: Insufficient documentation

## 2021-07-06 HISTORY — DX: Essential (primary) hypertension: I10

## 2021-07-06 LAB — AMYLASE: Amylase: 63 U/L (ref 28–100)

## 2021-07-06 LAB — CBC WITH DIFFERENTIAL/PLATELET
Abs Immature Granulocytes: 0.04 10*3/uL (ref 0.00–0.07)
Basophils Absolute: 0 10*3/uL (ref 0.0–0.1)
Basophils Relative: 0 %
Eosinophils Absolute: 0.1 10*3/uL (ref 0.0–0.5)
Eosinophils Relative: 1 %
HCT: 30.9 % — ABNORMAL LOW (ref 36.0–46.0)
Hemoglobin: 9.2 g/dL — ABNORMAL LOW (ref 12.0–15.0)
Immature Granulocytes: 1 %
Lymphocytes Relative: 24 %
Lymphs Abs: 1.8 10*3/uL (ref 0.7–4.0)
MCH: 20.4 pg — ABNORMAL LOW (ref 26.0–34.0)
MCHC: 29.8 g/dL — ABNORMAL LOW (ref 30.0–36.0)
MCV: 68.7 fL — ABNORMAL LOW (ref 80.0–100.0)
Monocytes Absolute: 0.7 10*3/uL (ref 0.1–1.0)
Monocytes Relative: 9 %
Neutro Abs: 4.8 10*3/uL (ref 1.7–7.7)
Neutrophils Relative %: 65 %
Platelets: 163 10*3/uL (ref 150–400)
RBC: 4.5 MIL/uL (ref 3.87–5.11)
RDW: 19.4 % — ABNORMAL HIGH (ref 11.5–15.5)
WBC: 7.4 10*3/uL (ref 4.0–10.5)
nRBC: 0 % (ref 0.0–0.2)

## 2021-07-06 LAB — LIPASE, BLOOD: Lipase: 27 U/L (ref 11–51)

## 2021-07-06 NOTE — MAU Provider Note (Signed)
History     CSN: [id number]  Arrival date and time: 07/06/21 1935   Event Date/Time   First Provider Initiated Contact with Patient 07/06/21 2025      Chief Complaint  Patient presents with   Abdominal Pain   Anna Welch is a 20 y.o. G3P1011 at 14w2dwho [place] [facility] [place]  She presents today for Abdominal Pain.  She states she ate some ice cream and laid down when the pain started. She reports it was about 30 minutes from eating to time of onset at around 1815. She states the pain was sharp at the top of her abdomen and "felt like it went to my back."  She states the pain "did not last long," but happened multiple times. She states she does not have current pain.  She endorses fetal movement and denies vaginal concerns including discharge, bleeding, and leaking. Patient denies history of gallstones or issues with gallbladder.  She denies problems with the pregnancy, but reports medical history significant for CHTN and Anemia.  She reports she is taking Procardia 391mXL daily and has taken her dosage for today.   Breakfast: FrPakistanoast Eggs Water OrIllinois Tool WorksLunch: ChVeterinary surgeonSnack: Ice Cream  Dinner: None Yet   OB History     Gravida  3   Para  1   Term  1   Preterm      AB  1   Living  1      SAB  1   IAB      Ectopic      Multiple      Live Births  1           Past Medical History:  Diagnosis Date   Acid reflux    Hypertension     Past Surgical History:  Procedure Laterality Date   MULTIPLE TOOTH EXTRACTIONS     TYMPANOSTOMY TUBE PLACEMENT      No family history on file.  Social History   Tobacco Use   Smoking status: Never   Smokeless tobacco: Never  Vaping Use   Vaping Use: Never used  Substance Use Topics   Alcohol use: Not Currently   Drug use: Not Currently    Allergies:  Allergies  Allergen Reactions   Nitrofurantoin Rash    Medications Prior to Admission  Medication  Sig Dispense Refill Last Dose   docusate sodium (COLACE) 100 MG capsule Take 100 mg by mouth 2 (two) times daily.   07/06/2021   ferrous sulfate 325 (65 FE) MG EC tablet Take 325 mg by mouth 3 (three) times daily with meals.   07/06/2021 at 1200   NIFEdipine (PROCARDIA-XL/NIFEDICAL-XL) 30 MG 24 hr tablet Take 30 mg by mouth daily.   07/06/2021   Prenatal Vit-Fe Fumarate-FA (PRENATAL PO) Take 1 tablet by mouth daily.   07/06/2021   AMBULATORY NON FORMULARY MEDICATION 1 Device by Other route once a week. Blood Pressure Cuff/ Medium Monitored Regularly at home ICD 10: O09.90 1 kit 0    ferrous fumarate (HEMOCYTE - 106 MG FE) 325 (106 Fe) MG TABS tablet Take 1 tablet (106 mg of iron total) by mouth daily. (Patient not taking: Reported on 09/14/2019) 30 each 3    HYDROcodone-acetaminophen (NORCO/VICODIN) 5-325 MG tablet Take 1 tablet by mouth every 8 (eight) hours as needed.      ibuprofen (ADVIL) 800 MG tablet Take 800 mg by mouth every 8 (eight) hours as  needed for mild pain or moderate pain.      lisinopril (ZESTRIL) 20 MG tablet Take 1 tablet (20 mg total) by mouth daily. 30 tablet 1    sulfamethoxazole-trimethoprim (BACTRIM DS) 800-160 MG tablet Take 1 tablet by mouth every 12 (twelve) hours. 20 tablet 0     Review of Systems  Constitutional:  Negative for chills and fever.  Eyes:  Negative for visual disturbance.  Respiratory:  Negative for cough and shortness of breath.   Gastrointestinal:  Positive for abdominal pain (None currently). Negative for constipation, diarrhea, nausea and vomiting.  Genitourinary:  Negative for difficulty urinating, dysuria, vaginal bleeding and vaginal discharge.  Musculoskeletal:  Positive for back pain.  Neurological:  Positive for dizziness (With onset of pain). Negative for light-headedness and headaches.  Physical Exam   Blood pressure 136/87, pulse (!) 101, temperature 98.5 F (36.9 C), resp. rate 18, height _0  (1.753 m), weight (!) 147 kg, SpO2 100 %,  unknown if currently breastfeeding.  Physical Exam Constitutional:      General: She is not in acute distress.    Appearance: She is well-developed. She is obese. She is not ill-appearing.  HENT:     Head: Normocephalic and atraumatic.  Cardiovascular:     Rate and Rhythm: Regular rhythm. Tachycardia present.  Pulmonary:     Effort: Pulmonary effort is normal. No respiratory distress.     Breath sounds: Normal breath sounds.  Abdominal:     General: Bowel sounds are normal.     Palpations: Abdomen is soft.     Tenderness: There is no abdominal tenderness.     Comments: Gravid  Genitourinary:    Rectum: Normal.  Skin:    General: Skin is warm and dry.  Neurological:     Mental Status: She is alert and oriented to person, place, and time.  Psychiatric:        Mood and Affect: Mood normal.        Behavior: Behavior normal.    Fetal Assessment 135 bpm, Mod Var, -Decels, +Accels Toco: No ctx graphed  MAU Course   Results for orders placed or performed during the hospital encounter of 07/06/21 (from the past 24 hour(s))  CBC with Differential/Platelet     Status: Abnormal   Collection Time: 07/06/21  8:56 PM  Result Value Ref Range   WBC 7.4 4.0 - 10.5 K/uL   RBC 4.50 3.87 - 5.11 MIL/uL   Hemoglobin 9.2 (L) 12.0 - 15.0 g/dL   HCT 30.9 (L) 36.0 - 46.0 %   MCV 68.7 (L) 80.0 - 100.0 fL   MCH 20.4 (L) 26.0 - 34.0 pg   MCHC 29.8 (L) 30.0 - 36.0 g/dL   RDW 19.4 (H) 11.5 - 15.5 %   Platelets 163 150 - 400 K/uL   nRBC 0.0 0.0 - 0.2 %   Neutrophils Relative % 65 %   Neutro Abs 4.8 1.7 - 7.7 K/uL   Lymphocytes Relative 24 %   Lymphs Abs 1.8 0.7 - 4.0 K/uL   Monocytes Relative 9 %   Monocytes Absolute 0.7 0.1 - 1.0 K/uL   Eosinophils Relative 1 %   Eosinophils Absolute 0.1 0.0 - 0.5 K/uL   Basophils Relative 0 %   Basophils Absolute 0.0 0.0 - 0.1 K/uL   Immature Granulocytes 1 %   Abs Immature Granulocytes 0.04 0.00 - 0.07 K/uL   Ovalocytes PRESENT   Lipase, blood      Status: None   Collection Time: 07/06/21  8:56 PM  Result Value Ref Range   Lipase 27 11 - 51 U/L  Amylase     Status: None   Collection Time: 07/06/21  8:56 PM  Result Value Ref Range   Amylase 63 28 - 100 U/L   US Abdomen Limited  Result Date: 07/06/2021 CLINICAL DATA:  Epigastric pain EXAM: ULTRASOUND ABDOMEN LIMITED RIGHT UPPER QUADRANT COMPARISON:  None. FINDINGS: Gallbladder: No gallstones or wall thickening visualized. No sonographic Murphy sign noted by sonographer. Common bile duct: Diameter: 2 mm Liver: No focal lesion identified. Within normal limits in parenchymal echogenicity. Portal vein is patent on color Doppler imaging with normal direction of blood flow towards the liver. Other: None. IMPRESSION: 1. Unremarkable right upper quadrant ultrasound. Electronically Signed   By: Randa Ngo M.D.   On: 07/06/2021 22:07    MDM PE Labs: Amylase, Lipase, CBC with Diff EFM Abdominal US Assessment and Plan  20 year old G3P1011  SIUP at 34.2 weeks Cat I FT Epigastric Pain   -POC Reviewed -Exam performed and findings discussed. -Informed that complaint is suspicious for gallstones. -Will send for limited abdominal US to assess. -Patient without current pain. -Will collect labs and await results.  -NST Reactive  Maryann Conners MSN, CNM 07/06/2021, 8:25 PM   Reassessment (10:14 PM) Indigestion vs Heartburn  -Results return without findings c/w gallstones. -Provider to bedside to discuss results. -Informed that pain was likely from heartburn and/or indigestion. -Discussed treatment methods with position changes and antacids when necessary. -Encouraged to modify nutritional intake to decrease incidents of epigastric pain. -Patient verbalizes understanding and has no questions or concerns. -Encouraged to call or return to MAU if symptoms worsen or with the onset of new symptoms. -Discharged to home in stable condition.  Maryann Conners MSN, CNM Advanced Practice  Provider, [facility] [employer]

## 2021-07-06 NOTE — MAU Note (Signed)
Pt arrived via EMS per recommendation of family reporting  sharp upper abdominal pain that radiates to her back. Pain began "a couple of hours ago"-pt reports she is unable to determine a distance between pain. Pt states the pain is not contraction pain or cramping type of pain. Currently rates pain at 0 but stated when it started it was an 8-10. Pt denies leaking of fluid, vaginal bleeding or bloody show. Endorses + fetal movement but states it has been decreased today.

## 2021-07-23 ENCOUNTER — Telehealth: Payer: Self-pay | Admitting: Hematology and Oncology

## 2021-07-23 NOTE — Telephone Encounter (Signed)
Anna Welch was referred by her obgyn for anemia in pregnancy. She returned my call and has been scheduled to see Dr. Leonides Schanz on 8/11 at 1pm. Pt aware to arrive 20 minutes early.

## 2021-08-02 ENCOUNTER — Inpatient Hospital Stay: Payer: Medicaid Other | Attending: Hematology and Oncology | Admitting: Hematology and Oncology

## 2021-08-02 ENCOUNTER — Inpatient Hospital Stay: Payer: Medicaid Other

## 2021-08-07 ENCOUNTER — Inpatient Hospital Stay (HOSPITAL_COMMUNITY)
Admission: AD | Admit: 2021-08-07 | Discharge: 2021-08-07 | Disposition: A | Discharge status: Home / Self Care | Payer: Medicaid Other | Attending: Obstetrics & Gynecology | Admitting: Obstetrics & Gynecology

## 2021-08-07 ENCOUNTER — Encounter (HOSPITAL_COMMUNITY): Payer: Self-pay | Admitting: Obstetrics & Gynecology

## 2021-08-07 DIAGNOSIS — Z3689 Encounter for other specified antenatal screening: Secondary | ICD-10-CM

## 2021-08-07 DIAGNOSIS — O471 False labor at or after 37 completed weeks of gestation: Secondary | ICD-10-CM | POA: Diagnosis present

## 2021-08-07 DIAGNOSIS — O479 False labor, unspecified: Secondary | ICD-10-CM

## 2021-08-07 DIAGNOSIS — Z3A38 38 weeks gestation of pregnancy: Secondary | ICD-10-CM

## 2021-08-07 NOTE — Discharge Instructions (Signed)
If your water breaks, come immediately to the hospital - [facility] is preferred (since your OBs are there), but whatever will be closest since you are at increased risk for a cord prolapse.

## 2021-08-07 NOTE — MAU Note (Signed)
Pt endorses +FM  °

## 2021-08-07 NOTE — MAU Note (Signed)
Pt presents to MAU via EMS for ctx that began around 5 min apart, but are now irregular per pt.  Pt denies LOF or vaginal bleeding, states that she receives prenatal care at [facility] in [place], [place] but came to the nearest hospital tonight  Pt states that her care team in [place] told her they are planning a repeat cesarean delivery.  Pt states the pregnancy has been complicated bu gestational hypertension and anemia (currently taking BP medication and iron supplement daily).  Pt denies recent intercourse or SVE, states that her cervix has not been checked in the office yet.  Pt has repeat c-section scheduled with [facility] providers for this Thursday 08/09/2021.

## 2021-08-07 NOTE — MAU Provider Note (Signed)
S: Ms. Anna Welch is a 20 y.o. G3P1011 at [redacted]w[redacted]d  who presents to MAU today complaining contractions q 10 minutes since around 11pm. She denies vaginal bleeding. She denies LOF. She reports normal fetal movement.  She spent all day cleaning and organizing around her house and was told (by her sister) several times to sit down and rest but did not. No other physical symptoms.  Receives care from a provider in [place] and plans to deliver there by a repeat Cesarean, scheduled for this Thursday. Reports baby was breech at last U/S "a few weeks ago".  Pertinent items noted in HPI and remainder of comprehensive ROS otherwise negative.  O: BP 135/82 (BP Location: Left Arm)   Pulse 97   Temp 98.7 F (37.1 C) (Oral)   Resp 18   Ht 5\' 10"  (1.778 m)   SpO2 100%   BMI 46.49 kg/m  GENERAL: Well-developed, well-nourished female in no acute distress.  HEAD: Normocephalic, atraumatic.  CHEST: Normal effort of breathing, regular heart rate ABDOMEN: Soft, nontender, gravid  Cervical exam:  Dilation: 1 Effacement (%): Thick Cervical Position: Posterior Presentation: Undeterminable Exam by:: [email or web address], CNM (External os 1, not all the way through)  Fetal Monitoring: reactive Baseline: 120 Variability: moderate Accelerations: 15x15 Decelerations: none Contractions: UI to occasional ctx q5-20min, pt reports feeling them as very mild and decreased significantly on admission to MAU  Bedside ultrasound performed to confirm presentation as no fetal part could be palpated with cervical exam. Baby is vertex, but not at all in the pelvis, head is positioned overriding the pubic bone (easily palpated at bottom of her belly which is somewhat pendulous).  Pt counseled about importance of coming to hospital quickly if her water breaks prior to CS since she is at risk of cord prolapse without baby's head in the pelvis.   A: SIUP at [redacted]w[redacted]d  False labor with reactive NST  P: Discharge home in stable  conditions with 3rd trimester precautions - see AVS for additional education provided Follow up at as scheduled for ongoing prenatal care  [redacted]w[redacted]d, CNM 08/07/2021 1:59 AM

## 2021-08-15 ENCOUNTER — Inpatient Hospital Stay: Payer: Medicaid Other | Admitting: Hematology and Oncology

## 2021-08-15 ENCOUNTER — Telehealth: Payer: Self-pay | Admitting: *Deleted

## 2021-08-15 ENCOUNTER — Telehealth: Payer: Self-pay | Admitting: Hematology and Oncology

## 2021-08-15 ENCOUNTER — Inpatient Hospital Stay: Payer: Medicaid Other

## 2021-08-15 NOTE — Telephone Encounter (Signed)
TCT patient regarding her appt.Spoke with her significant other. Advised that she does not need to come to her appt today as she is so close to her delivery date. Her SO advised that she had the baby on 08/08/21. Then advised that we would re-schedule her for 6 weeks. He voiced understanding and will let her know.  New patient scheduling message sent

## 2021-08-15 NOTE — Telephone Encounter (Signed)
R/s appts per 8/24 inbasket msg from [employer]. Called pt, no answer. Left msg with appts date and times.

## 2021-09-11 IMAGING — CT CT ANGIO CHEST
2 of 6 series · 18 of 36 positions shown · IV contrast (omnipaque)
Comparison: None

CLINICAL DATA: Pt is 5 days Post-Partum; Sudden onset of SOB last
night. Pt denies any chest pain;

EXAM:
CT ANGIOGRAPHY CHEST WITH CONTRAST
TECHNIQUE: Multidetector CT imaging of the chest was performed using the
standard protocol during bolus administration of intravenous
contrast. Multiplanar CT image reconstructions and MIPs were
obtained to evaluate the vascular anatomy.
CONTRAST:  75mL OMNIPAQUE IOHEXOL 350 MG/ML SOLN

[Series 8: pe thins · axial · 0.88mm/px · z∈[+1111,+1349]mm · 17 of 376 slices shown]
[im 18/376  lung]
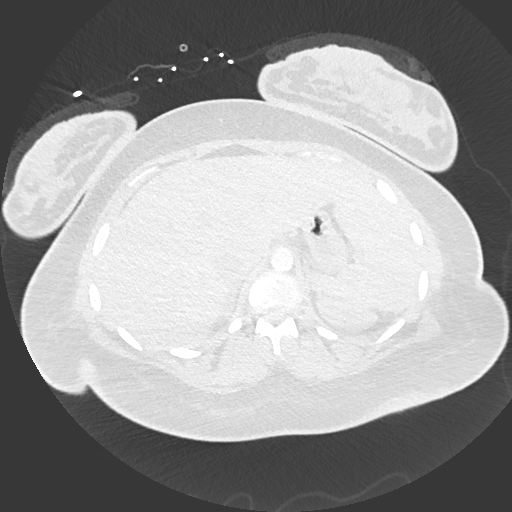
[im 35/376  mediastinal]
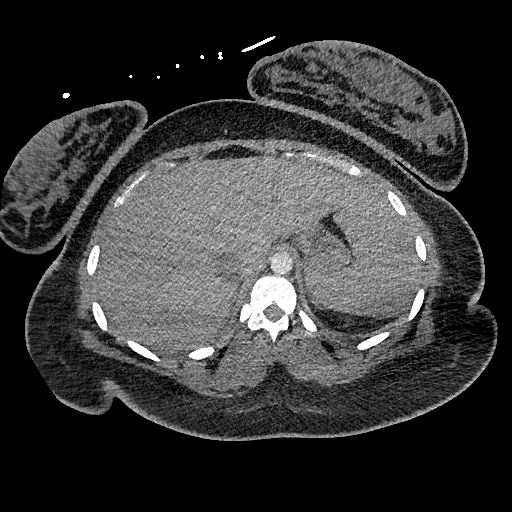
[im 69/376  lung]
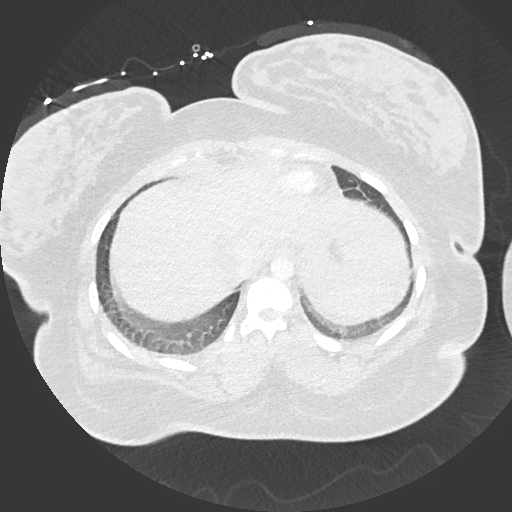
[im 86/376  mediastinal]
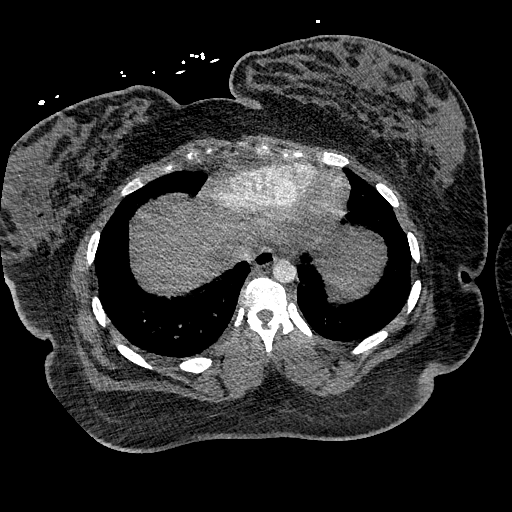
[im 103/376  lung]
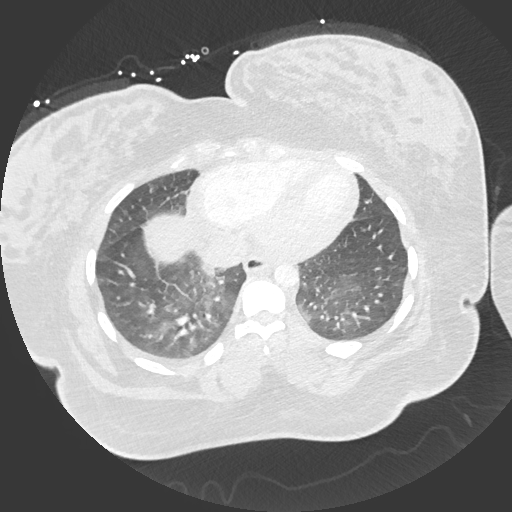
[im 120/376  mediastinal]
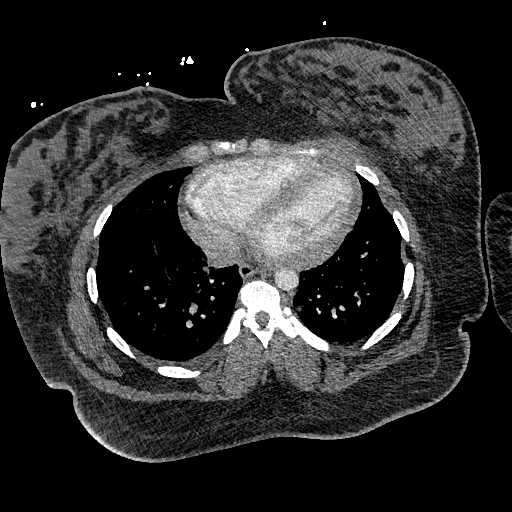
[im 154/376  lung]
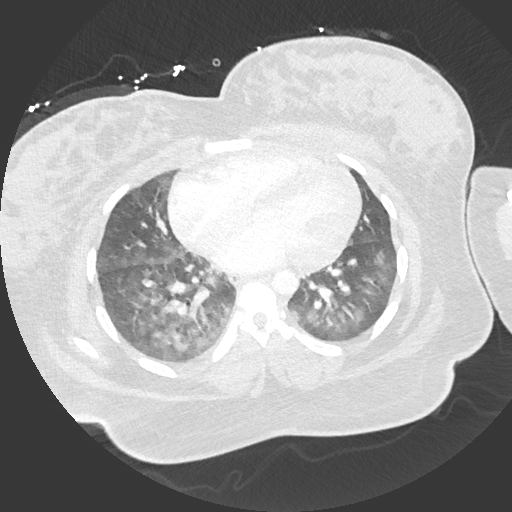
[im 171/376  mediastinal]
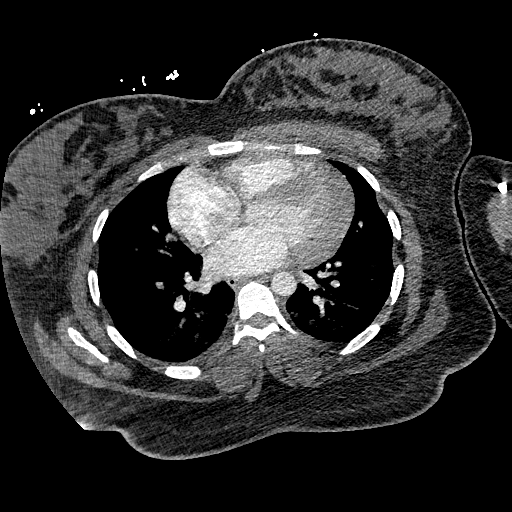
[im 188/376  lung]
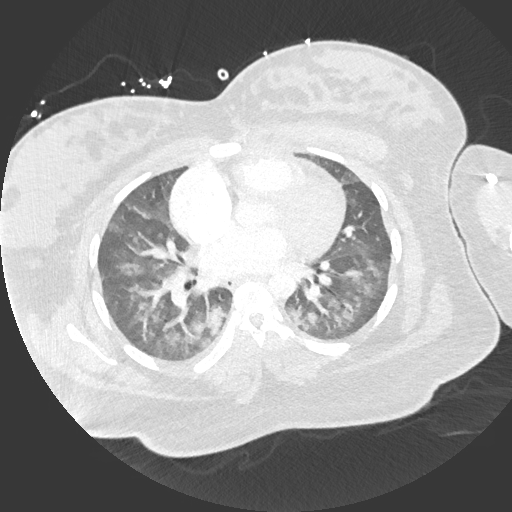
[im 205/376  mediastinal]
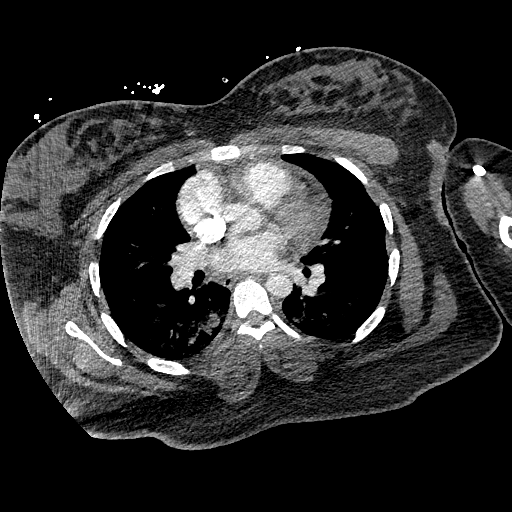
[im 222/376  lung]
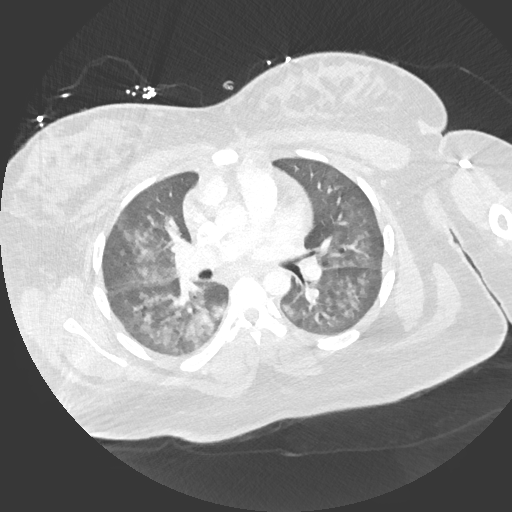
[im 256/376  mediastinal]
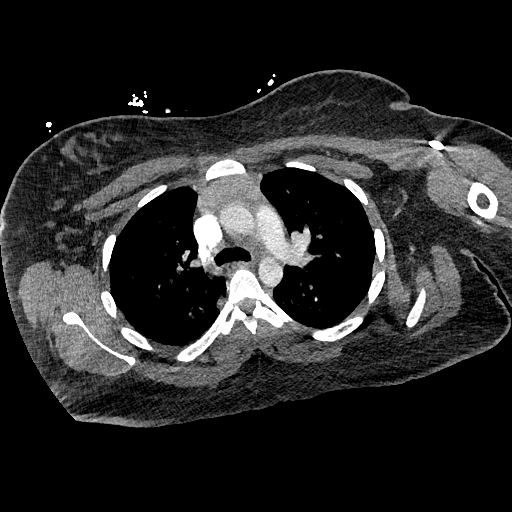
[im 273/376  lung]
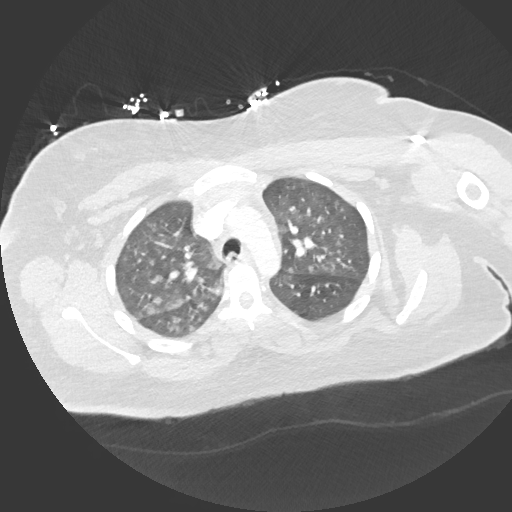
[im 290/376  mediastinal]
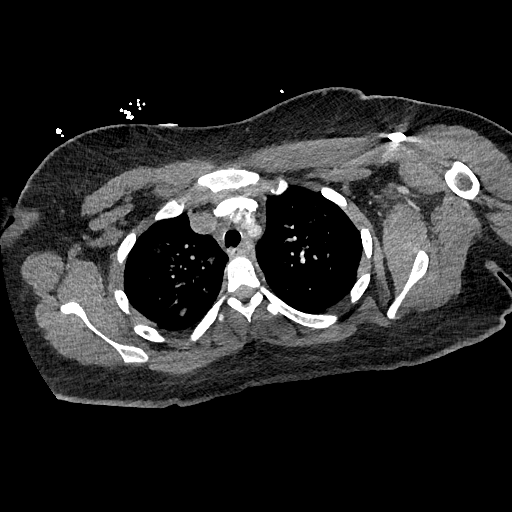
[im 307/376  lung]
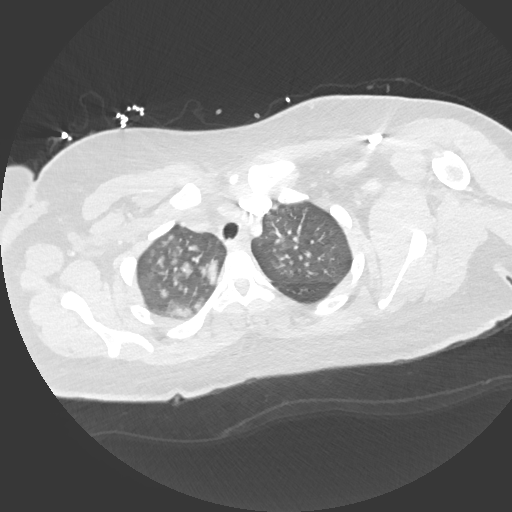
[im 341/376  mediastinal]
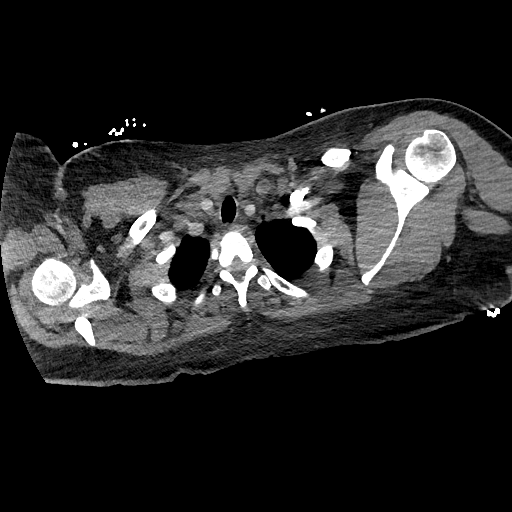
[im 358/376  lung]
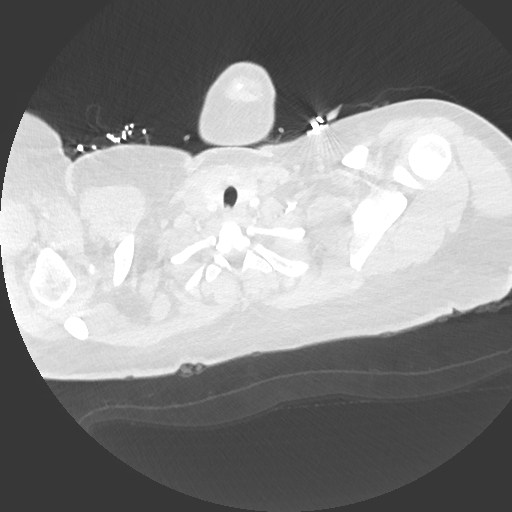

[Series 9: pe 2mm cor · coronal · 0.51mm/px · 1 of 101 slices shown]
[im 51/101  mediastinal]
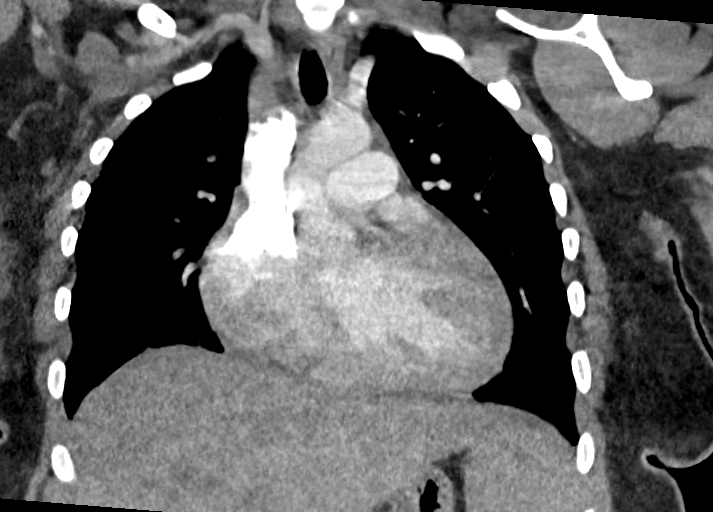

[18 of 36 positions shown; findings below may reference images not displayed]

FINDINGS: Cardiovascular: Heart size upper limits normal. Trace pericardial
fluid. Fair contrast opacification of pulmonary artery and its
branches. Contrast rate and timing is good; her hyperdynamic
postpartum state and body habitus result in limited pulmonary
arterial branch opacification. No convincing large central pulmonary
emboli. Adequate contrast opacification of the thoracic aorta with
no evidence of dissection, aneurysm, or stenosis. There is classic
3-vessel brachiocephalic arch anatomy without proximal stenosis. No
significant atheromatous change.

Mediastinum/Nodes: Residual thymic tissue. No hilar or mediastinal
adenopathy.

Lungs/Pleura: No pleural effusion. No pneumothorax. Patchy
ill-defined scattered somewhat nodular airspace opacities throughout
both lungs, right worse than left, with peripheral sparing,
involving bases more than apices.

Upper Abdomen: No acute findings.

Musculoskeletal: No chest wall abnormality. No acute or significant
osseous findings.

Review of the MIP images confirms the above findings.
IMPRESSION: 1. Patchy ill-defined nodular airspace opacities throughout both
lungs, right worse than left. While nonspecific, primary
considerations include atypical pulmonary edema, infectious
including viral, and inflammatory etiologies.
2. Negative for acute PE (with caveats as above) or thoracic aortic
dissection.

## 2021-09-11 IMAGING — DX DG CHEST 2V
2 series · 2 of 2 positions shown · non-contrast
Comparison: None.

CLINICAL DATA: Shortness of breath and cough. Status post C-section
delivery 6 days ago.

EXAM:
CHEST - 2 VIEW

[w chest pa]
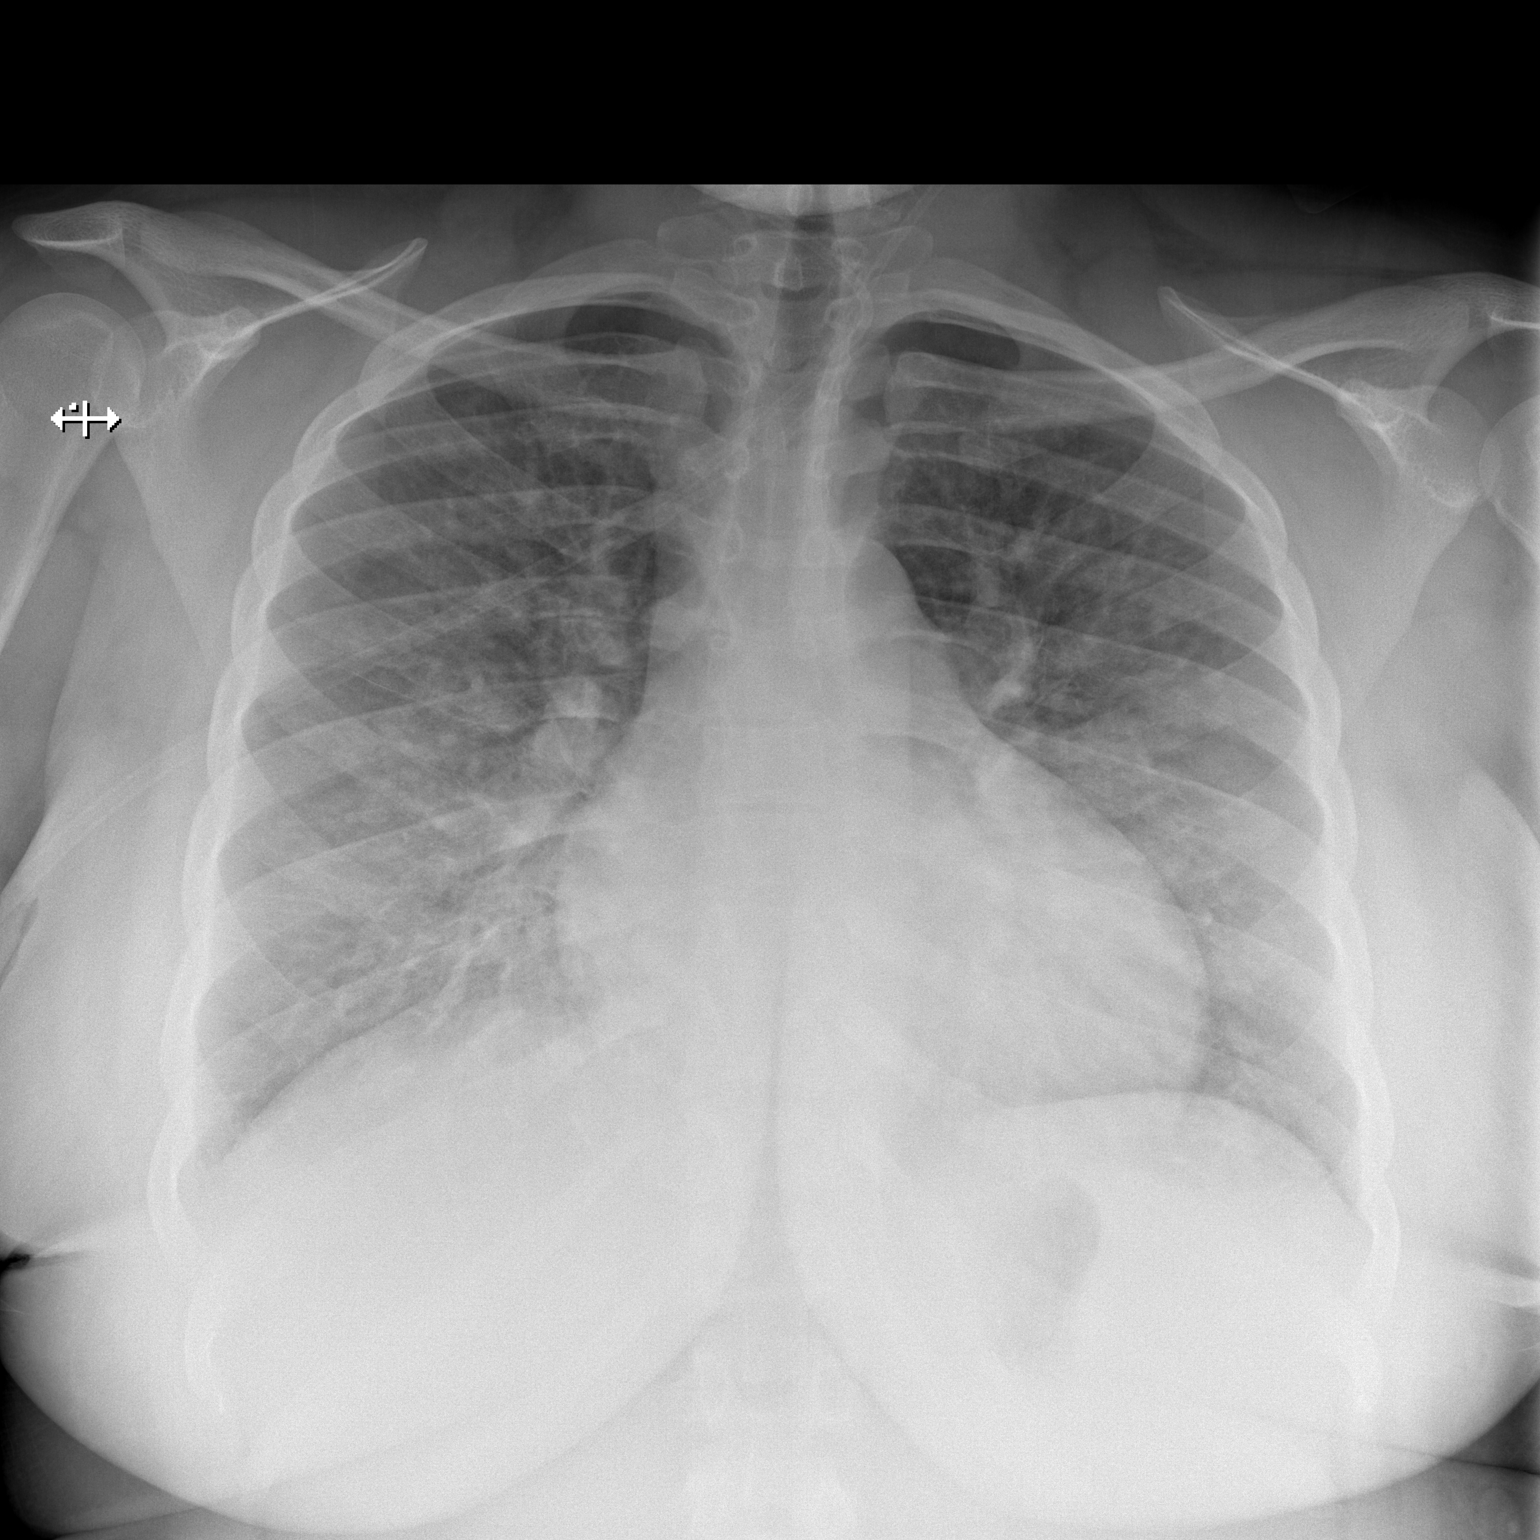

[w chest lat]
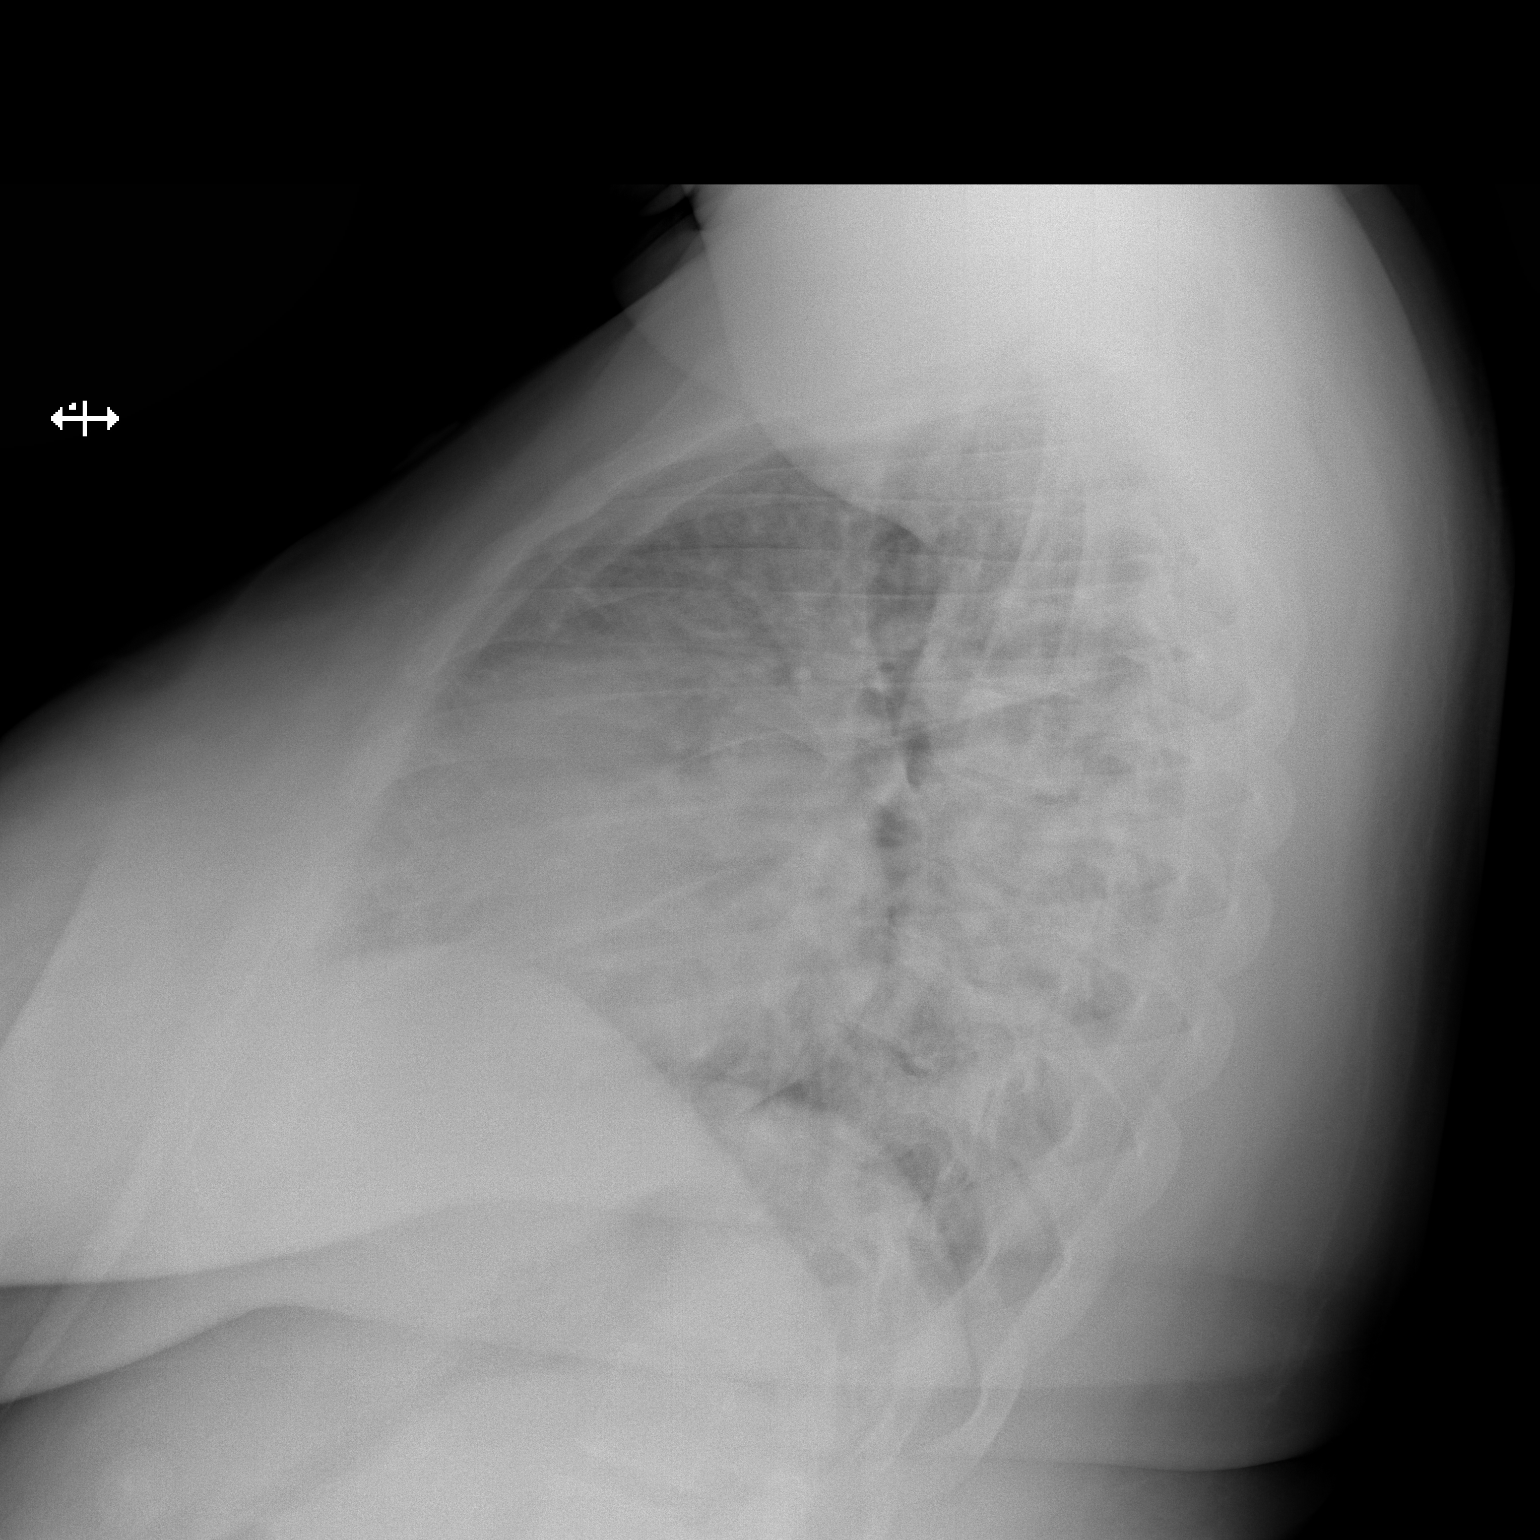

[2 of 2 positions shown; findings below may reference images not displayed]

FINDINGS: The heart is mildly enlarged. Diffuse interstitial and airspace
pattern is present bilaterally. There is no significant
consolidation. No effusions are present.
IMPRESSION: Borderline enlargement of the heart and a mild diffuse interstitial
pattern consistent with edema. This may represent postpartum
cardiogenic edema. Differential diagnosis includes pulmonary
embolism and infection.

## 2021-09-25 ENCOUNTER — Other Ambulatory Visit: Payer: Self-pay

## 2021-09-25 ENCOUNTER — Ambulatory Visit (HOSPITAL_COMMUNITY)
Admission: EM | Admit: 2021-09-25 | Discharge: 2021-09-25 | Disposition: A | Discharge status: Home / Self Care | Payer: Medicaid Other | Attending: Family Medicine | Admitting: Family Medicine

## 2021-09-25 ENCOUNTER — Encounter (HOSPITAL_COMMUNITY): Payer: Self-pay

## 2021-09-25 DIAGNOSIS — K0889 Other specified disorders of teeth and supporting structures: Secondary | ICD-10-CM | POA: Diagnosis not present

## 2021-09-25 MED ORDER — LIDOCAINE VISCOUS HCL 2 % MT SOLN: 10.0000 mL | OROMUCOSAL | 0 refills | Status: DC | PRN

## 2021-09-25 MED ORDER — AMOXICILLIN-POT CLAVULANATE 875-125 MG PO TABS: 1.0000 | ORAL_TABLET | Freq: Two times a day (BID) | ORAL | 0 refills | Status: DC

## 2021-09-25 NOTE — ED Triage Notes (Signed)
Pt reports dental pain x 1-2 weeks. Ibuprofen gives no relief.

## 2021-09-25 NOTE — ED Provider Notes (Signed)
[facility]    CSN: [id number] Arrival date & time: 09/25/21  7035      History   Chief Complaint Chief Complaint  Patient presents with   Dental Pain    HPI Anna Welch is a 20 y.o. female.   Patient presenting today with 1 week history of progressively worsening left lower posterior molar dental pain.  Denies fever, chills, headache, drainage from the area.  Has had this issue off and on in the past but was unable to get the tooth removed as she has been pregnant up until recently.  So far taking ibuprofen with minimal relief.   Past Medical History:  Diagnosis Date   Acid reflux    Hypertension     Patient Active Problem List   Diagnosis Date Noted   Cesarean wound infection 09/15/2019   Pre-eclampsia superimposed on chronic hypertension, postpartum 09/14/2019   Anemia in pregnancy 06/02/2019   Supervision of high risk pregnancy, antepartum 05/19/2019   Preexisting hypertension complicating pregnancy, antepartum 05/19/2019   UTI (urinary tract infection) during pregnancy 05/19/2019   Otorrhea of left ear 05/18/2019    Past Surgical History:  Procedure Laterality Date   CESAREAN SECTION     MULTIPLE TOOTH EXTRACTIONS     TYMPANOSTOMY TUBE PLACEMENT      OB History     Gravida  3   Para  1   Term  1   Preterm      AB  1   Living  1      SAB  1   IAB      Ectopic      Multiple      Live Births  1            Home Medications    Prior to Admission medications   Medication Sig Start Date End Date Taking? Authorizing Provider  amoxicillin-clavulanate (AUGMENTIN) 875-125 MG tablet Take 1 tablet by mouth every 12 (twelve) hours. 09/25/21  Yes Particia Nearing, PA-C  lidocaine (XYLOCAINE) 2 % solution Use as directed 10 mLs in the mouth or throat as needed for mouth pain. 09/25/21  Yes Particia Nearing, PA-C  AMBULATORY NON FORMULARY MEDICATION 1 Device by Other route once a week. Blood Pressure Cuff/  Medium Monitored Regularly at home ICD 10: O09.90 06/02/19   Conan Bowens, MD  docusate sodium (COLACE) 100 MG capsule Take 100 mg by mouth 2 (two) times daily.    [provider]  ferrous sulfate 325 (65 FE) MG EC tablet Take 325 mg by mouth 3 (three) times daily with meals.    [provider]  lisinopril (ZESTRIL) 20 MG tablet Take 1 tablet (20 mg total) by mouth daily. 09/17/19   Hermina Staggers, MD  NIFEdipine (PROCARDIA-XL/NIFEDICAL-XL) 30 MG 24 hr tablet Take 30 mg by mouth daily.    [provider]  Prenatal Vit-Fe Fumarate-FA (PRENATAL PO) Take 1 tablet by mouth daily.    [provider]    Family History History reviewed. No pertinent family history.  Social History Social History   Tobacco Use   Smoking status: Never   Smokeless tobacco: Never  Vaping Use   Vaping Use: Never used  Substance Use Topics   Alcohol use: Not Currently   Drug use: Not Currently     Allergies   Nitrofurantoin   Review of Systems Review of Systems Per HPI  Physical Exam Triage Vital Signs ED Triage Vitals  Enc Vitals Group  BP 09/25/21 1057 (!) 168/109     Pulse Rate 09/25/21 1057 64     Resp 09/25/21 1057 18     Temp 09/25/21 1057 98.4 F (36.9 C)     Temp Source 09/25/21 1057 Oral     SpO2 09/25/21 1057 98 %     Weight --      Height --      Head Circumference --      Peak Flow --      Pain Score 09/25/21 1055 9     Pain Loc --      Pain Edu? --      Excl. in GC? --    No data found.  Updated Vital Signs BP (!) 168/109 (BP Location: Left Arm)   Pulse 64   Temp 98.4 F (36.9 C) (Oral)   Resp 18   LMP 09/19/2021 (Exact Date)   SpO2 98%   Breastfeeding Unknown   Visual Acuity Right Eye Distance:   Left Eye Distance:   Bilateral Distance:    Right Eye Near:   Left Eye Near:    Bilateral Near:     Physical Exam Vitals and nursing note reviewed.  Constitutional:      Appearance: Normal appearance. She is not  ill-appearing.  HENT:     Head: Atraumatic.     Mouth/Throat:     Mouth: Mucous membranes are moist.     Pharynx: Posterior oropharyngeal erythema present.     Comments: Gingival erythema left posterior lower molar, decay present in tooth Eyes:     Extraocular Movements: Extraocular movements intact.     Conjunctiva/sclera: Conjunctivae normal.  Cardiovascular:     Rate and Rhythm: Normal rate and regular rhythm.     Heart sounds: Normal heart sounds.  Pulmonary:     Effort: Pulmonary effort is normal.     Breath sounds: Normal breath sounds.  Musculoskeletal:        General: Normal range of motion.     Cervical back: Normal range of motion and neck supple.  Skin:    General: Skin is warm and dry.  Neurological:     Mental Status: She is alert and oriented to person, place, and time.  Psychiatric:        Mood and Affect: Mood normal.        Thought Content: Thought content normal.        Judgment: Judgment normal.     UC Treatments / Results  Labs (all labs ordered are listed, but only abnormal results are displayed) Labs Reviewed - No data to display  EKG   Radiology No results found.  Procedures Procedures (including critical care time)  Medications Ordered in UC Medications - No data to display  Initial Impression / Assessment and Plan / UC Course  I have reviewed the triage vital signs and the nursing notes.  Pertinent labs & imaging results that were available during my care of the patient were reviewed by me and considered in my medical decision making (see chart for details).     Will cover with Augmentin, viscous lidocaine, over-the-counter pain relievers.  Follow-up with dentist as soon as possible.  Patient states she is not breast-feeding currently.  Final Clinical Impressions(s) / UC Diagnoses   Final diagnoses:  Pain, dental   Discharge Instructions   None    ED Prescriptions     Medication Sig Dispense Auth. Provider    amoxicillin-clavulanate (AUGMENTIN) 875-125 MG tablet Take 1 tablet by mouth  every 12 (twelve) hours. 14 tablet Particia Nearing, [place]   lidocaine (XYLOCAINE) 2 % solution Use as directed 10 mLs in the mouth or throat as needed for mouth pain. 100 mL Particia Nearing, [place]      PDMP not reviewed this encounter.   Particia Nearing, [place] 09/25/21 1140

## 2021-09-26 ENCOUNTER — Inpatient Hospital Stay: Payer: Medicaid Other | Attending: Hematology and Oncology | Admitting: Hematology and Oncology

## 2021-09-26 ENCOUNTER — Inpatient Hospital Stay: Payer: Medicaid Other

## 2021-11-06 ENCOUNTER — Ambulatory Visit: Payer: Self-pay | Admitting: Nurse Practitioner

## 2022-06-27 ENCOUNTER — Encounter (HOSPITAL_COMMUNITY): Payer: Self-pay | Admitting: Emergency Medicine

## 2022-06-27 ENCOUNTER — Ambulatory Visit (HOSPITAL_COMMUNITY)
Admission: EM | Admit: 2022-06-27 | Discharge: 2022-06-27 | Disposition: A | Discharge status: Home / Self Care | Payer: Medicaid Other | Attending: Emergency Medicine | Admitting: Emergency Medicine

## 2022-06-27 ENCOUNTER — Ambulatory Visit (INDEPENDENT_AMBULATORY_CARE_PROVIDER_SITE_OTHER): Payer: Medicaid Other

## 2022-06-27 DIAGNOSIS — M25562 Pain in left knee: Secondary | ICD-10-CM

## 2022-06-27 DIAGNOSIS — S82002A Unspecified fracture of left patella, initial encounter for closed fracture: Secondary | ICD-10-CM

## 2022-06-27 DIAGNOSIS — W19XXXA Unspecified fall, initial encounter: Secondary | ICD-10-CM | POA: Diagnosis not present

## 2022-06-27 DIAGNOSIS — T148XXA Other injury of unspecified body region, initial encounter: Secondary | ICD-10-CM

## 2022-06-27 MED ORDER — KETOROLAC TROMETHAMINE 30 MG/ML IJ SOLN
INTRAMUSCULAR | Status: AC
  Filled 2022-06-27: qty 1

## 2022-06-27 MED ORDER — KETOROLAC TROMETHAMINE 30 MG/ML IJ SOLN
30.0000 mg | Freq: Once | INTRAMUSCULAR | Status: AC
  Administered 2022-06-27: 30 mg via INTRAMUSCULAR

## 2022-06-27 NOTE — Discharge Instructions (Addendum)
Ibuprofen every 6 hours as needed for pain. Rest and elevate the leg.  Apply cold compresses or ice intermittently as needed.   Please follow up with orthopedics. They have walk-in hours or you can call to make an appointment.  You may need an MRI to further evaluate the knee.  If you have any worsening symptoms please go to the emergency department

## 2022-06-27 NOTE — ED Provider Notes (Signed)
[facility]    CSN: [id number] Arrival date & time: 06/27/22  1216     History   Chief Complaint Chief Complaint  Patient presents with   Knee Injury   Fall    HPI Anna Welch is a 21 y.o. female.  Presents after tripping at the entrance of a store and falling on left knee.  Reports she did not hit her head or lose consciousness.  She was not able to put weight on the knee after this occurred.  She is sitting in wheelchair in the room.  Mostly feels pain when bending the knee.  Reports it was at first 8/10 but has dulled a little bit. Denies any swelling of the leg, bleeding or laceration. No medications taken PTA  Past Medical History:  Diagnosis Date   Acid reflux    Hypertension     Patient Active Problem List   Diagnosis Date Noted   Cesarean wound infection 09/15/2019   Pre-eclampsia superimposed on chronic hypertension, postpartum 09/14/2019   Anemia in pregnancy 06/02/2019   Supervision of high risk pregnancy, antepartum 05/19/2019   Preexisting hypertension complicating pregnancy, antepartum 05/19/2019   UTI (urinary tract infection) during pregnancy 05/19/2019   Otorrhea of left ear 05/18/2019    Past Surgical History:  Procedure Laterality Date   CESAREAN SECTION     MULTIPLE TOOTH EXTRACTIONS     TYMPANOSTOMY TUBE PLACEMENT      OB History     Gravida  3   Para  1   Term  1   Preterm      AB  1   Living  1      SAB  1   IAB      Ectopic      Multiple      Live Births  1            Home Medications    Prior to Admission medications   Medication Sig Start Date End Date Taking? Authorizing Provider  AMBULATORY NON FORMULARY MEDICATION 1 Device by Other route once a week. Blood Pressure Cuff/ Medium Monitored Regularly at home ICD 10: O09.90 06/02/19   Conan Bowens, MD  amoxicillin-clavulanate (AUGMENTIN) 875-125 MG tablet Take 1 tablet by mouth every 12 (twelve) hours. 09/25/21   Particia Nearing, PA-C   docusate sodium (COLACE) 100 MG capsule Take 100 mg by mouth 2 (two) times daily.    [provider]  ferrous sulfate 325 (65 FE) MG EC tablet Take 325 mg by mouth 3 (three) times daily with meals.    [provider]  lidocaine (XYLOCAINE) 2 % solution Use as directed 10 mLs in the mouth or throat as needed for mouth pain. 09/25/21   Particia Nearing, PA-C  lisinopril (ZESTRIL) 20 MG tablet Take 1 tablet (20 mg total) by mouth daily. 09/17/19   Hermina Staggers, MD  NIFEdipine (PROCARDIA-XL/NIFEDICAL-XL) 30 MG 24 hr tablet Take 30 mg by mouth daily.    [provider]  Prenatal Vit-Fe Fumarate-FA (PRENATAL PO) Take 1 tablet by mouth daily.    [provider]    Family History History reviewed. No pertinent family history.  Social History Social History   Tobacco Use   Smoking status: Never   Smokeless tobacco: Never  Vaping Use   Vaping Use: Never used  Substance Use Topics   Alcohol use: Not Currently   Drug use: Not Currently     Allergies   Nitrofurantoin   Review  of Systems Review of Systems Per HPI  Physical Exam Triage Vital Signs ED Triage Vitals  Enc Vitals Group     BP 06/27/22 1232 (!) 166/84     Pulse Rate 06/27/22 1232 69     Resp 06/27/22 1232 18     Temp 06/27/22 1232 98 F (36.7 C)     Temp src --      SpO2 06/27/22 1232 98 %     Weight --      Height --      Head Circumference --      Peak Flow --      Pain Score 06/27/22 1231 8     Pain Loc --      Pain Edu? --      Excl. in GC? --    No data found.  Updated Vital Signs BP (!) 166/84   Pulse 69   Temp 98 F (36.7 C)   Resp 18   SpO2 98%   Breastfeeding No    Physical Exam Vitals and nursing note reviewed.  Constitutional:      General: She is not in acute distress. HENT:     Mouth/Throat:     Pharynx: Oropharynx is clear.  Cardiovascular:     Rate and Rhythm: Normal rate and regular rhythm.     Pulses: Normal pulses.  Pulmonary:      Effort: Pulmonary effort is normal.     Breath sounds: Normal breath sounds.  Musculoskeletal:        General: No swelling, tenderness or deformity.     Comments: Sitting in wheelchair with left leg fully extended.  Pain with bending.  Sensation intact, DP and PT pulses intact.  No bone intact.  No obvious deformity or soft tissue swelling.  Skin:    General: Skin is warm and dry.     Findings: No bruising.  Neurological:     Mental Status: She is alert and oriented to person, place, and time.     UC Treatments / Results  Labs (all labs ordered are listed, but only abnormal results are displayed) Labs Reviewed - No data to display  EKG  Radiology DG Knee AP/LAT W/Sunrise Left  Result Date: 06/27/2022 CLINICAL DATA:  Left knee pain after fall. EXAM: LEFT KNEE 3 VIEWS COMPARISON:  None Available. FINDINGS: Questionable tiny avulsion fracture of the medial patella. Overlying soft tissue swelling. No additional fracture. No dislocation. Mild medial compartment joint space narrowing. Bone mineralization is normal. IMPRESSION: 1. Questionable tiny avulsion fracture of the medial patella with overlying soft tissue swelling. Correlate for transient patellar dislocation and consider further evaluation with nonemergent outpatient MRI. 2. Mild medial compartment osteoarthritis. Electronically Signed   By: Obie Dredge M.D.   On: 06/27/2022 13:37    Procedures Procedures   Medications Ordered in UC Medications  ketorolac (TORADOL) 30 MG/ML injection 30 mg (30 mg Intramuscular Given 06/27/22 1339)    Initial Impression / Assessment and Plan / UC Course  I have reviewed the triage vital signs and the nursing notes.  Pertinent labs & imaging results that were available during my care of the patient were reviewed by me and considered in my medical decision making (see chart for details).  Toradol injection given with much improvement in pain. Able to bend knee and walk.  Left knee x-ray  questionable for avulsion fracture of medial patella.  Soft tissue swelling.  Radiology recommends outpatient MRI for which I agree.  Recommend follow-up with orthopedics.  RICE therapy in the meantime.  Ibuprofen every 6 hours for pain.  Understands no NSAIDs for the next 6 hours due to Toradol dose.  Return precautions discussed. ED for worsening symptoms. Patient agrees to plan and is discharged in stable condition.  Final Clinical Impressions(s) / UC Diagnoses   Final diagnoses:  Acute pain of left knee  Avulsion fracture     Discharge Instructions      Ibuprofen every 6 hours as needed for pain. Rest and elevate the leg.  Apply cold compresses or ice intermittently as needed.   Please follow up with orthopedics. They have walk-in hours or you can call to make an appointment.  You may need an MRI to further evaluate the knee.  If you have any worsening symptoms please go to the emergency department      ED Prescriptions   None    PDMP not reviewed this encounter.   Shaneeka Scarboro, Lurena Joiner, [place] 06/27/22 2836

## 2022-06-27 NOTE — ED Triage Notes (Signed)
Pt is present today with left knee pain from a fall today at a furniture store. Pt denies LOC or hitting head

## 2022-08-03 ENCOUNTER — Ambulatory Visit (HOSPITAL_COMMUNITY)
Admission: EM | Admit: 2022-08-03 | Discharge: 2022-08-03 | Disposition: A | Discharge status: Home / Self Care | Payer: Medicaid Other | Attending: Physician Assistant | Admitting: Physician Assistant

## 2022-08-03 ENCOUNTER — Encounter (HOSPITAL_COMMUNITY): Payer: Self-pay

## 2022-08-03 DIAGNOSIS — Z792 Long term (current) use of antibiotics: Secondary | ICD-10-CM | POA: Diagnosis not present

## 2022-08-03 DIAGNOSIS — K047 Periapical abscess without sinus: Secondary | ICD-10-CM | POA: Diagnosis present

## 2022-08-03 DIAGNOSIS — J069 Acute upper respiratory infection, unspecified: Secondary | ICD-10-CM

## 2022-08-03 DIAGNOSIS — H60501 Unspecified acute noninfective otitis externa, right ear: Secondary | ICD-10-CM | POA: Diagnosis present

## 2022-08-03 DIAGNOSIS — Z20822 Contact with and (suspected) exposure to covid-19: Secondary | ICD-10-CM | POA: Diagnosis not present

## 2022-08-03 MED ORDER — AMOXICILLIN 500 MG PO CAPS: 500.0000 mg | ORAL_CAPSULE | Freq: Three times a day (TID) | ORAL | 0 refills | Status: DC

## 2022-08-03 MED ORDER — CIPRODEX 0.3-0.1 % OT SUSP: 4.0000 [drp] | Freq: Two times a day (BID) | OTIC | 0 refills | Status: AC

## 2022-08-03 NOTE — ED Provider Notes (Signed)
[facility]    CSN: [id number] Arrival date & time: 08/03/22  1326      History   Chief Complaint Chief Complaint  Patient presents with   Sinusitis    EAR PAIN   Ear Pain    HPI Anna Welch is a 21 y.o. female.   Patient here today for evaluation of right ear pain, right jaw pain, concerns for abscessed tooth, and nasal congestion that started a few days ago. She has not had fever. She denies nausea, vomiting and diarrhea. She is here today with her sister who is also having upper respiratory symptoms. She has not taken any medication for symptoms.   The history is provided by the patient.  Sinusitis Associated symptoms: congestion and ear pain   Associated symptoms: no chills, no cough, no fever, no nausea, no shortness of breath, no sore throat, no vomiting and no wheezing     Past Medical History:  Diagnosis Date   Acid reflux    Hypertension     Patient Active Problem List   Diagnosis Date Noted   Cesarean wound infection 09/15/2019   Pre-eclampsia superimposed on chronic hypertension, postpartum 09/14/2019   Anemia in pregnancy 06/02/2019   Supervision of high risk pregnancy, antepartum 05/19/2019   Preexisting hypertension complicating pregnancy, antepartum 05/19/2019   UTI (urinary tract infection) during pregnancy 05/19/2019   Otorrhea of left ear 05/18/2019    Past Surgical History:  Procedure Laterality Date   CESAREAN SECTION     MULTIPLE TOOTH EXTRACTIONS     TYMPANOSTOMY TUBE PLACEMENT      OB History     Gravida  3   Para  1   Term  1   Preterm      AB  1   Living  1      SAB  1   IAB      Ectopic      Multiple      Live Births  1            Home Medications    Prior to Admission medications   Medication Sig Start Date End Date Taking? Authorizing Provider  amoxicillin (AMOXIL) 500 MG capsule Take 1 capsule (500 mg total) by mouth 3 (three) times daily. 08/03/22  Yes Tomi Bamberger, PA-C   ciprofloxacin-dexamethasone (CIPRODEX) OTIC suspension Place 4 drops into the right ear 2 (two) times daily for 7 days. 08/03/22 08/10/22 Yes Tomi Bamberger, PA-C  AMBULATORY NON FORMULARY MEDICATION 1 Device by Other route once a week. Blood Pressure Cuff/ Medium Monitored Regularly at home ICD 10: O09.90 06/02/19   Conan Bowens, MD  amoxicillin-clavulanate (AUGMENTIN) 875-125 MG tablet Take 1 tablet by mouth every 12 (twelve) hours. 09/25/21   Particia Nearing, PA-C  docusate sodium (COLACE) 100 MG capsule Take 100 mg by mouth 2 (two) times daily.    [provider]  ferrous sulfate 325 (65 FE) MG EC tablet Take 325 mg by mouth 3 (three) times daily with meals.    [provider]  lidocaine (XYLOCAINE) 2 % solution Use as directed 10 mLs in the mouth or throat as needed for mouth pain. 09/25/21   Particia Nearing, PA-C  lisinopril (ZESTRIL) 20 MG tablet Take 1 tablet (20 mg total) by mouth daily. 09/17/19   Hermina Staggers, MD  NIFEdipine (PROCARDIA-XL/NIFEDICAL-XL) 30 MG 24 hr tablet Take 30 mg by mouth daily.    [provider]  Prenatal Vit-Fe Fumarate-FA (PRENATAL PO)  Take 1 tablet by mouth daily.    [provider]    Family History History reviewed. No pertinent family history.  Social History Social History   Tobacco Use   Smoking status: Never   Smokeless tobacco: Never  Vaping Use   Vaping Use: Never used  Substance Use Topics   Alcohol use: Not Currently   Drug use: Not Currently     Allergies   Nitrofurantoin   Review of Systems Review of Systems  Constitutional:  Negative for chills and fever.  HENT:  Positive for congestion and ear pain. Negative for sinus pressure and sore throat.   Eyes:  Negative for discharge and redness.  Respiratory:  Negative for cough, shortness of breath and wheezing.   Gastrointestinal:  Negative for abdominal pain, diarrhea, nausea and vomiting.     Physical Exam Triage Vital  Signs ED Triage Vitals [08/03/22 1356]  Enc Vitals Group     BP (!) 150/113     Pulse Rate 85     Resp 18     Temp 99 F (37.2 C)     Temp Source Oral     SpO2 97 %     Weight      Height      Head Circumference      Peak Flow      Pain Score      Pain Loc      Pain Edu?      Excl. in GC?    No data found.  Updated Vital Signs BP (!) 150/113 (BP Location: Left Arm)   Pulse 85   Temp 99 F (37.2 C) (Oral)   Resp 18   SpO2 97%      Physical Exam Vitals and nursing note reviewed.  Constitutional:      General: She is not in acute distress.    Appearance: Normal appearance. She is not ill-appearing.  HENT:     Head: Normocephalic and atraumatic.     Left Ear: Tympanic membrane normal.     Ears:     Comments: Right EAC inflamed, white exudate, unable to visualize right TM    Nose: Congestion (mild) present.     Mouth/Throat:     Mouth: Mucous membranes are moist.     Pharynx: No oropharyngeal exudate or posterior oropharyngeal erythema.     Comments: Missing tooth to bottom right - inflammation noted Eyes:     Conjunctiva/sclera: Conjunctivae normal.  Cardiovascular:     Rate and Rhythm: Normal rate and regular rhythm.     Heart sounds: Normal heart sounds. No murmur heard. Pulmonary:     Effort: Pulmonary effort is normal. No respiratory distress.     Breath sounds: Normal breath sounds. No wheezing, rhonchi or rales.  Skin:    General: Skin is warm and dry.  Neurological:     Mental Status: She is alert.  Psychiatric:        Mood and Affect: Mood normal.        Thought Content: Thought content normal.      UC Treatments / Results  Labs (all labs ordered are listed, but only abnormal results are displayed) Labs Reviewed  SARS CORONAVIRUS 2 (TAT 6-24 HRS)    EKG   Radiology No results found.  Procedures Procedures (including critical care time)  Medications Ordered in UC Medications - No data to display  Initial Impression / Assessment and  Plan / UC Course  I have reviewed the triage vital signs  and the nursing notes.  Pertinent labs & imaging results that were available during my care of the patient were reviewed by me and considered in my medical decision making (see chart for details).    We will treat with antibiotic drops as well as oral antibiotic to cover abscess and otitis externa.  COVID screening ordered.  Encouraged follow-up with any further concerns.  Final Clinical Impressions(s) / UC Diagnoses   Final diagnoses:  Acute otitis externa of right ear, unspecified type  Dental abscess  Upper respiratory tract infection, unspecified type   Discharge Instructions   None    ED Prescriptions     Medication Sig Dispense Auth. Provider   ciprofloxacin-dexamethasone (CIPRODEX) OTIC suspension Place 4 drops into the right ear 2 (two) times daily for 7 days. 7.5 mL Tomi Bamberger, PA-C   amoxicillin (AMOXIL) 500 MG capsule Take 1 capsule (500 mg total) by mouth 3 (three) times daily. 21 capsule Tomi Bamberger, PA-C      PDMP not reviewed this encounter.   Tomi Bamberger, PA-C 08/03/22 1428

## 2022-08-03 NOTE — ED Triage Notes (Signed)
Pt reports sinus pressure x 1-2 days. Pt reports ear pain and tooth ache.

## 2022-08-04 LAB — SARS CORONAVIRUS 2 (TAT 6-24 HRS): SARS Coronavirus 2: NEGATIVE

## 2022-10-01 ENCOUNTER — Ambulatory Visit (HOSPITAL_COMMUNITY)
Admission: EM | Admit: 2022-10-01 | Discharge: 2022-10-01 | Disposition: A | Discharge status: Home / Self Care | Payer: Medicaid Other | Attending: Family Medicine | Admitting: Family Medicine

## 2022-10-01 ENCOUNTER — Encounter (HOSPITAL_COMMUNITY): Payer: Self-pay | Admitting: *Deleted

## 2022-10-01 ENCOUNTER — Other Ambulatory Visit: Payer: Self-pay

## 2022-10-01 DIAGNOSIS — Z3201 Encounter for pregnancy test, result positive: Secondary | ICD-10-CM

## 2022-10-01 DIAGNOSIS — N3000 Acute cystitis without hematuria: Secondary | ICD-10-CM | POA: Diagnosis present

## 2022-10-01 LAB — POCT URINALYSIS DIPSTICK, ED / UC
Bilirubin Urine: NEGATIVE
Glucose, UA: NEGATIVE mg/dL
Nitrite: POSITIVE — AB
Protein, ur: 30 mg/dL — AB
Specific Gravity, Urine: 1.025 (ref 1.005–1.030)
Urobilinogen, UA: 1 mg/dL (ref 0.0–1.0)
pH: 6.5 (ref 5.0–8.0)

## 2022-10-01 LAB — POC URINE PREG, ED: Preg Test, Ur: POSITIVE — AB

## 2022-10-01 MED ORDER — CEPHALEXIN 500 MG PO CAPS: 500.0000 mg | ORAL_CAPSULE | Freq: Two times a day (BID) | ORAL | 0 refills | Status: AC

## 2022-10-01 NOTE — ED Triage Notes (Signed)
PT reports back pain and thinks she has a UTI from taking anti-bx for teeth pain.

## 2022-10-01 NOTE — ED Provider Notes (Signed)
[facility]    CSN: [id number] Arrival date & time: 10/01/22  1330      History   Chief Complaint Chief Complaint  Patient presents with   uti    HPI Anna Welch is a 21 y.o. female.   Patient presents with bilateral lower back pain, urinary frequency and dark in urine for 7 days.  Has not attempted treatment of symptoms.  Denies hematuria, dysuria, vaginal discharge, itching or odor.  Sexually active.  Last menstrual period unknown .  Endorses that she did a home pregnancy test which was inconclusive.    Past Medical History:  Diagnosis Date   Acid reflux    Hypertension     Patient Active Problem List   Diagnosis Date Noted   Cesarean wound infection 09/15/2019   Pre-eclampsia superimposed on chronic hypertension, postpartum 09/14/2019   Anemia in pregnancy 06/02/2019   Supervision of high risk pregnancy, antepartum 05/19/2019   Preexisting hypertension complicating pregnancy, antepartum 05/19/2019   UTI (urinary tract infection) during pregnancy 05/19/2019   Otorrhea of left ear 05/18/2019    Past Surgical History:  Procedure Laterality Date   CESAREAN SECTION     MULTIPLE TOOTH EXTRACTIONS     TYMPANOSTOMY TUBE PLACEMENT      OB History     Gravida  3   Para  1   Term  1   Preterm      AB  1   Living  1      SAB  1   IAB      Ectopic      Multiple      Live Births  1            Home Medications    Prior to Admission medications   Medication Sig Start Date End Date Taking? Authorizing Provider  AMBULATORY NON FORMULARY MEDICATION 1 Device by Other route once a week. Blood Pressure Cuff/ Medium Monitored Regularly at home ICD 10: O09.90 06/02/19   Conan Bowens, MD  amoxicillin (AMOXIL) 500 MG capsule Take 1 capsule (500 mg total) by mouth 3 (three) times daily. 08/03/22   Tomi Bamberger, PA-C  amoxicillin-clavulanate (AUGMENTIN) 875-125 MG tablet Take 1 tablet by mouth every 12 (twelve) hours. 09/25/21   Particia Nearing, PA-C  docusate sodium (COLACE) 100 MG capsule Take 100 mg by mouth 2 (two) times daily.    [provider]  ferrous sulfate 325 (65 FE) MG EC tablet Take 325 mg by mouth 3 (three) times daily with meals.    [provider]  lidocaine (XYLOCAINE) 2 % solution Use as directed 10 mLs in the mouth or throat as needed for mouth pain. 09/25/21   Particia Nearing, PA-C  lisinopril (ZESTRIL) 20 MG tablet Take 1 tablet (20 mg total) by mouth daily. 09/17/19   Hermina Staggers, MD  NIFEdipine (PROCARDIA-XL/NIFEDICAL-XL) 30 MG 24 hr tablet Take 30 mg by mouth daily.    [provider]  Prenatal Vit-Fe Fumarate-FA (PRENATAL PO) Take 1 tablet by mouth daily.    [provider]    Family History History reviewed. No pertinent family history.  Social History Social History   Tobacco Use   Smoking status: Never   Smokeless tobacco: Never  Vaping Use   Vaping Use: Never used  Substance Use Topics   Alcohol use: Not Currently   Drug use: Not Currently     Allergies   Nitrofurantoin   Review of Systems Review of  Systems  Constitutional: Negative.   Respiratory: Negative.    Genitourinary:  Positive for flank pain, frequency and pelvic pain. Negative for decreased urine volume, difficulty urinating, dyspareunia, dysuria, enuresis, genital sores, hematuria, menstrual problem, urgency, vaginal bleeding, vaginal discharge and vaginal pain.     Physical Exam Triage Vital Signs ED Triage Vitals  Enc Vitals Group     BP 10/01/22 1415 (!) 151/84     Pulse Rate 10/01/22 1415 86     Resp 10/01/22 1415 18     Temp 10/01/22 1415 98.6 F (37 C)     Temp src --      SpO2 10/01/22 1415 98 %     Weight --      Height --      Head Circumference --      Peak Flow --      Pain Score 10/01/22 1407 4     Pain Loc --      Pain Edu? --      Excl. in [place]? --    No data found.  Updated Vital Signs BP (!) 151/84   Pulse 86   Temp 98.6 F (37  C)   Resp 18   LMP  (LMP Unknown)   SpO2 98%   Visual Acuity Right Eye Distance:   Left Eye Distance:   Bilateral Distance:    Right Eye Near:   Left Eye Near:    Bilateral Near:     Physical Exam Constitutional:      Appearance: Normal appearance.  Eyes:     Extraocular Movements: Extraocular movements intact.  Pulmonary:     Effort: Pulmonary effort is normal.  Abdominal:     General: Abdomen is flat. Bowel sounds are normal.     Palpations: Abdomen is soft.     Tenderness: There is no right CVA tenderness or left CVA tenderness.  Neurological:     Mental Status: She is alert and oriented to person, place, and time.      UC Treatments / Results  Labs (all labs ordered are listed, but only abnormal results are displayed) Labs Reviewed - No data to display  EKG   Radiology No results found.  Procedures Procedures (including critical care time)  Medications Ordered in UC Medications - No data to display  Initial Impression / Assessment and Plan / UC Course  I have reviewed the triage vital signs and the nursing notes.  Pertinent labs & imaging results that were available during my care of the patient were reviewed by me and considered in my medical decision making (see chart for details).  Acute cystitis without hematuria, positive pregnancy test  Urinalysis showing leukocytes and nitrates, sent for culture, discussed with patient, urine pregnancy test is positive, discussed with patient, Keflex prescribed, advised patient to increase fluid intake and practice good hygiene for additional supportive measures, advised against any alcohol, tobacco or illicit drug use and advised to take prenatals if she plans to move forward with pregnancy, given information to the [facility] to follow-up to determine gestational age Final Clinical Impressions(s) / UC Diagnoses   Final diagnoses:  None   Discharge Instructions   None    ED Prescriptions    None    PDMP not reviewed this encounter.   Hans Eden, NP 10/01/22 [phone]

## 2022-10-01 NOTE — Discharge Instructions (Addendum)
Your urinalysis shows Emberlynn Riggan blood cells and nitrates which are indicative of infection, your urine will be sent to the lab to determine exactly which bacteria is present, if any changes need to be made to your medications you will be notified  Begin use of keflex every morning and evening for 5 days   Urine pregnancy positive, please refrain from alcohol tobacco or drug usage, you will need to determine child's gestational age before you can make a decision on how you would like to move forward, please schedule an appointment with the [facility] to complete this, if you decide that she would like to move forward with pregnancy, please begin use of prenatal vitamins  You may use over-the-counter Azo to help minimize your symptoms until antibiotic removes bacteria, this medication will turn your urine orange  Increase your fluid intake through use of water  As always practice good hygiene, wiping front to back and avoidance of scented vaginal products to prevent further irritation  If symptoms continue to persist after use of medication or recur please follow-up with urgent care or your primary doctor as needed

## 2022-10-02 ENCOUNTER — Telehealth: Payer: Self-pay | Admitting: Family Medicine

## 2022-10-02 DIAGNOSIS — O3680X0 Pregnancy with inconclusive fetal viability, not applicable or unspecified: Secondary | ICD-10-CM

## 2022-10-02 NOTE — Telephone Encounter (Signed)
Patient needs order for dating u/s

## 2022-10-02 NOTE — Telephone Encounter (Signed)
Call placed back to pt. Pt did not answer. Pt was seen at Urgent care for + UPT.  Pt needing dating/viability [place] scheduled. Pt scheduled for 10/24 at 930am. Pt given date and time of appt.  Future orders placed in Epic.  Will send mychart message today.  Colletta Maryland, RNC

## 2022-10-03 ENCOUNTER — Telehealth (HOSPITAL_COMMUNITY): Payer: Self-pay | Admitting: Emergency Medicine

## 2022-10-03 LAB — URINE CULTURE: Culture: >=100000 — AB

## 2022-10-03 MED ORDER — FOSFOMYCIN TROMETHAMINE 3 G PO PACK: 3.0000 g | PACK | Freq: Once | ORAL | 0 refills | Status: AC

## 2022-10-14 ENCOUNTER — Inpatient Hospital Stay (HOSPITAL_COMMUNITY)
Admission: AD | Admit: 2022-10-14 | Discharge: 2022-10-14 | Disposition: A | Discharge status: Home / Self Care | Payer: Medicaid Other | Attending: Family Medicine | Admitting: Family Medicine

## 2022-10-14 ENCOUNTER — Inpatient Hospital Stay (HOSPITAL_COMMUNITY): Payer: Medicaid Other

## 2022-10-14 ENCOUNTER — Encounter (HOSPITAL_COMMUNITY): Payer: Self-pay | Admitting: *Deleted

## 2022-10-14 DIAGNOSIS — O26899 Other specified pregnancy related conditions, unspecified trimester: Secondary | ICD-10-CM | POA: Diagnosis not present

## 2022-10-14 DIAGNOSIS — R1030 Lower abdominal pain, unspecified: Secondary | ICD-10-CM

## 2022-10-14 DIAGNOSIS — I10 Essential (primary) hypertension: Secondary | ICD-10-CM

## 2022-10-14 DIAGNOSIS — R109 Unspecified abdominal pain: Secondary | ICD-10-CM | POA: Diagnosis not present

## 2022-10-14 DIAGNOSIS — O26891 Other specified pregnancy related conditions, first trimester: Secondary | ICD-10-CM | POA: Diagnosis not present

## 2022-10-14 DIAGNOSIS — O26851 Spotting complicating pregnancy, first trimester: Secondary | ICD-10-CM | POA: Insufficient documentation

## 2022-10-14 DIAGNOSIS — Z3A01 Less than 8 weeks gestation of pregnancy: Secondary | ICD-10-CM | POA: Insufficient documentation

## 2022-10-14 HISTORY — DX: Anemia, unspecified: D64.9

## 2022-10-14 LAB — URINALYSIS, ROUTINE W REFLEX MICROSCOPIC
Bilirubin Urine: NEGATIVE
Glucose, UA: NEGATIVE mg/dL
Ketones, ur: NEGATIVE mg/dL
Nitrite: NEGATIVE
Protein, ur: 30 mg/dL — AB
RBC / HPF: >50 RBC/hpf — ABNORMAL HIGH (ref 0–5)
Specific Gravity, Urine: 1.023 (ref 1.005–1.030)
WBC, UA: >50 WBC/hpf — ABNORMAL HIGH (ref 0–5)
pH: 5 (ref 5.0–8.0)

## 2022-10-14 LAB — CBC
HCT: 31.8 % — ABNORMAL LOW (ref 36.0–46.0)
Hemoglobin: 9.8 g/dL — ABNORMAL LOW (ref 12.0–15.0)
MCH: 20.3 pg — ABNORMAL LOW (ref 26.0–34.0)
MCHC: 30.8 g/dL (ref 30.0–36.0)
MCV: 65.8 fL — ABNORMAL LOW (ref 80.0–100.0)
Platelets: 226 10*3/uL (ref 150–400)
RBC: 4.83 MIL/uL (ref 3.87–5.11)
RDW: 17.4 % — ABNORMAL HIGH (ref 11.5–15.5)
WBC: 5.8 10*3/uL (ref 4.0–10.5)
nRBC: 0 % (ref 0.0–0.2)

## 2022-10-14 LAB — COMPREHENSIVE METABOLIC PANEL
ALT: 9 U/L (ref 0–44)
AST: 11 U/L — ABNORMAL LOW (ref 15–41)
Albumin: 3.9 g/dL (ref 3.5–5.0)
Alkaline Phosphatase: 57 U/L (ref 38–126)
Anion gap: 7 (ref 5–15)
BUN: 9 mg/dL (ref 6–20)
CO2: 23 mmol/L (ref 22–32)
Calcium: 9.7 mg/dL (ref 8.9–10.3)
Chloride: 108 mmol/L (ref 98–111)
Creatinine, Ser: 0.66 mg/dL (ref 0.44–1.00)
GFR, Estimated: >60 mL/min (ref >60)
Glucose, Bld: 91 mg/dL (ref 70–99)
Potassium: 3.8 mmol/L (ref 3.5–5.1)
Sodium: 138 mmol/L (ref 135–145)
Total Bilirubin: 0.4 mg/dL (ref 0.3–1.2)
Total Protein: 7.4 g/dL (ref 6.5–8.1)

## 2022-10-14 LAB — HCG, QUANTITATIVE, PREGNANCY: hCG, Beta Chain, Quant, S: 14782 m[IU]/mL — ABNORMAL HIGH (ref <5)

## 2022-10-14 LAB — WET PREP, GENITAL
Clue Cells Wet Prep HPF POC: NONE SEEN
Sperm: NONE SEEN
Trich, Wet Prep: NONE SEEN
WBC, Wet Prep HPF POC: >=10 — AB (ref <10)
Yeast Wet Prep HPF POC: NONE SEEN

## 2022-10-14 MED ORDER — NIFEDIPINE ER OSMOTIC RELEASE 30 MG PO TB24: 30.0000 mg | ORAL_TABLET | Freq: Every day | ORAL | 2 refills | Status: DC

## 2022-10-14 MED ORDER — PREPLUS 27-1 MG PO TABS: 1.0000 | ORAL_TABLET | Freq: Every day | ORAL | 2 refills | Status: DC

## 2022-10-14 MED ORDER — FERROUS SULFATE 325 (65 FE) MG PO TBEC: 325.0000 mg | DELAYED_RELEASE_TABLET | ORAL | 3 refills | Status: DC

## 2022-10-14 NOTE — MAU Note (Signed)
Anna Welch is a 21 y.o. at Unknown here in MAU reporting: came in by EMS. Had pain on rt side last night.  Woke up this morning with cramping in lower abd and vagina.  Spotting last 3 days. (Reddish mucous)  had intercourse at 0200 today. LMP: ? Early Sept, doesn't really remember.  Hasn't been seen yet Onset of complaint: 3 days ago Pain score: mild now Vitals:   10/14/22 1303  BP: (!) 149/82  Pulse: 73  Resp: 18  Temp: 98.4 F (36.9 C)  SpO2: 100%      Lab orders placed from triage:   +preg test at [facility], has [place] scheduled for tomorrow.

## 2022-10-14 NOTE — Discharge Instructions (Signed)
  [facility] Providers          [facility]  [address], [place], [place] [address]  [phone]  [facility]  [address], [place], [place] [address]  [phone]  [facility]  [address], [place], [place] [address]  [phone]  [facility] [address], [place], [place] [address] [phone]  [facility] [place]  [address], [place], [place] [address]  [phone]  [facility]  [address] floor), [place], [place] [address]  [phone]  [facility]  [address], [place], [place] [address]  [phone]  [facility]  [address], [place], [place] [address]  [phone]  [facility]  [address], [place], [place] [address]  [phone]  [facility]  [address], [place], [place] [address]  [phone]  [facility]  [address], [place], [place] [address]  [phone]  [facility]  [address], [place], [place] [address]  [phone]  [facility]   [address], [place], [place] [address]  [phone]  [facility]  [address], [place],  [address]   [phone]  [facility] [address], ,  [address] [phone]  [facility]  [address], ,  [address]  [phone]         

## 2022-10-14 NOTE — MAU Provider Note (Signed)
History     CSN: [id number]  Arrival date and time: 10/14/22 1214   Event Date/Time   First Provider Initiated Contact with Patient 10/14/22 1338      Chief Complaint  Patient presents with   Vaginal Bleeding   Abdominal Pain   Possible Pregnancy   Anna Welch is a 21 y.o. [id number] with positive UPT, but unsure LMP.  She reports she recalls having a menses in Sept and thinks it was at the beginning.  presents today for Vaginal Bleeding and Abdominal Pain. She reports she had some mild cramping last night that was present on her left side.  She states upon waking it was worse, but located in the lower stomach.   She states the pain is intermittent and improved with rest and worsened with movement. She denies current pain and rates it a 0/10.  Patient also reports some spotting with wiping that is reddish color with mucoid type consistency.  She reports sexual activity in the past 3 days. She reports a history of CHTN.      OB History     Gravida  4   Para  2   Term  2   Preterm      AB  1   Living  2      SAB  1   IAB      Ectopic      Multiple      Live Births  2           Past Medical History:  Diagnosis Date   Acid reflux    Anemia    Hypertension     Past Surgical History:  Procedure Laterality Date   CESAREAN SECTION     MULTIPLE TOOTH EXTRACTIONS     TYMPANOSTOMY TUBE PLACEMENT      Family History  Problem Relation Age of Onset   Hypertension Mother    Stroke Father    Hypertension Father     Social History   Tobacco Use   Smoking status: Never   Smokeless tobacco: Never  Vaping Use   Vaping Use: Former  Substance Use Topics   Alcohol use: Not Currently   Drug use: Not Currently    Allergies:  Allergies  Allergen Reactions   Nitrofurantoin Rash    Medications Prior to Admission  Medication Sig Dispense Refill Last Dose   AMBULATORY NON FORMULARY MEDICATION 1 Device by Other route once a week. Blood Pressure Cuff/  Medium Monitored Regularly at home ICD 10: O09.90 1 kit 0    amoxicillin (AMOXIL) 500 MG capsule Take 1 capsule (500 mg total) by mouth 3 (three) times daily. 21 capsule 0    amoxicillin-clavulanate (AUGMENTIN) 875-125 MG tablet Take 1 tablet by mouth every 12 (twelve) hours. 14 tablet 0    docusate sodium (COLACE) 100 MG capsule Take 100 mg by mouth 2 (two) times daily.      ferrous sulfate 325 (65 FE) MG EC tablet Take 325 mg by mouth 3 (three) times daily with meals.      lidocaine (XYLOCAINE) 2 % solution Use as directed 10 mLs in the mouth or throat as needed for mouth pain. 100 mL 0    lisinopril (ZESTRIL) 20 MG tablet Take 1 tablet (20 mg total) by mouth daily. 30 tablet 1    NIFEdipine (PROCARDIA-XL/NIFEDICAL-XL) 30 MG 24 hr tablet Take 30 mg by mouth daily.      Prenatal Vit-Fe Fumarate-FA (PRENATAL PO) Take 1 tablet by mouth daily.  Review of Systems  Gastrointestinal:  Positive for abdominal pain.  Genitourinary:  Positive for vaginal bleeding. Negative for difficulty urinating, dyspareunia, dysuria and vaginal discharge.  Musculoskeletal:  Negative for back pain.  Neurological:  Negative for dizziness, light-headedness and headaches.   Physical Exam   Blood pressure (!) 149/82, pulse 73, temperature 98.4 F (36.9 C), temperature source Oral, resp. rate 18, height 5' 9" (1.753 m), weight 135.6 kg, SpO2 100 %, not currently breastfeeding.  Physical Exam Vitals reviewed. Exam conducted with a chaperone present.  Constitutional:      Appearance: Normal appearance. She is well-developed.  HENT:     Head: Normocephalic and atraumatic.  Eyes:     Conjunctiva/sclera: Conjunctivae normal.  Cardiovascular:     Rate and Rhythm: Normal rate.  Pulmonary:     Effort: Pulmonary effort is normal. No respiratory distress.  Musculoskeletal:        General: Normal range of motion.     Cervical back: Normal range of motion.  Skin:    General: Skin is warm and dry.  Neurological:      Mental Status: She is alert and oriented to person, place, and time.  Psychiatric:        Mood and Affect: Mood normal.        Behavior: Behavior normal.     MAU Course  Procedures Results for orders placed or performed during the hospital encounter of 10/14/22 (from the past 24 hour(s))  CBC     Status: Abnormal   Collection Time: 10/14/22 12:41 PM  Result Value Ref Range   WBC 5.8 4.0 - 10.5 K/uL   RBC 4.83 3.87 - 5.11 MIL/uL   Hemoglobin 9.8 (L) 12.0 - 15.0 g/dL   HCT 31.8 (L) 36.0 - 46.0 %   MCV 65.8 (L) 80.0 - 100.0 fL   MCH 20.3 (L) 26.0 - 34.0 pg   MCHC 30.8 30.0 - 36.0 g/dL   RDW 17.4 (H) 11.5 - 15.5 %   Platelets 226 150 - 400 K/uL   nRBC 0.0 0.0 - 0.2 %  hCG, quantitative, pregnancy     Status: Abnormal   Collection Time: 10/14/22 12:41 PM  Result Value Ref Range   hCG, Beta Chain, Quant, S 14,782 (H) <5 mIU/mL  Comprehensive metabolic panel     Status: Abnormal   Collection Time: 10/14/22 12:41 PM  Result Value Ref Range   Sodium 138 135 - 145 mmol/L   Potassium 3.8 3.5 - 5.1 mmol/L   Chloride 108 98 - 111 mmol/L   CO2 23 22 - 32 mmol/L   Glucose, Bld 91 70 - 99 mg/dL   BUN 9 6 - 20 mg/dL   Creatinine, Ser 0.66 0.44 - 1.00 mg/dL   Calcium 9.7 8.9 - 10.3 mg/dL   Total Protein 7.4 6.5 - 8.1 g/dL   Albumin 3.9 3.5 - 5.0 g/dL   AST 11 (L) 15 - 41 U/L   ALT 9 0 - 44 U/L   Alkaline Phosphatase 57 38 - 126 U/L   Total Bilirubin 0.4 0.3 - 1.2 mg/dL   GFR, Estimated >60 >60 mL/min   Anion gap 7 5 - 15  Wet prep, genital     Status: Abnormal   Collection Time: 10/14/22  1:17 PM   Specimen: PATH Cytology Cervicovaginal Ancillary Only  Result Value Ref Range   Yeast Wet Prep HPF POC NONE SEEN NONE SEEN   Trich, Wet Prep NONE SEEN NONE SEEN   Clue Cells Wet Prep  HPF POC NONE SEEN NONE SEEN   WBC, Wet Prep HPF POC >=10 (A) <10   Sperm NONE SEEN   Urinalysis, Routine w reflex microscopic Urine, Clean Catch     Status: Abnormal   Collection Time: 10/14/22  1:29  PM  Result Value Ref Range   Color, Urine YELLOW YELLOW   APPearance CLOUDY (A) CLEAR   Specific Gravity, Urine 1.023 1.005 - 1.030   pH 5.0 5.0 - 8.0   Glucose, UA NEGATIVE NEGATIVE mg/dL   Hgb urine dipstick LARGE (A) NEGATIVE   Bilirubin Urine NEGATIVE NEGATIVE   Ketones, ur NEGATIVE NEGATIVE mg/dL   Protein, ur 30 (A) NEGATIVE mg/dL   Nitrite NEGATIVE NEGATIVE   Leukocytes,Ua LARGE (A) NEGATIVE   RBC / HPF >50 (H) 0 - 5 RBC/hpf   WBC, UA >50 (H) 0 - 5 WBC/hpf   Bacteria, UA RARE (A) NONE SEEN   Squamous Epithelial / LPF 0-5 0 - 5   WBC Clumps PRESENT    Mucus PRESENT    US OB LESS THAN 14 WEEKS WITH OB TRANSVAGINAL  Result Date: 10/14/2022 CLINICAL DATA:  [id number] Abdominal cramping affecting pregnancy [id number]. Cramping. Unsure menstrual period. EXAM: OBSTETRIC <14 WK [place] AND TRANSVAGINAL OB US TECHNIQUE: Both transabdominal and transvaginal ultrasound examinations were performed for complete evaluation of the gestation as well as the maternal uterus, adnexal regions, and pelvic cul-de-sac. Transvaginal technique was performed to assess early pregnancy. COMPARISON:  None Available. FINDINGS: Intrauterine gestational sac: Single Yolk sac:  Visualized. Embryo:  Visualized. Cardiac Activity: Visualized. Heart Rate: 113  bpm CRL:  4.6  mm   6 w   1 d                  [place] EDC: 06/08/23 Subchorionic hemorrhage:  None visualized. Maternal uterus/adnexae: Bilateral ovaries are unremarkable. IMPRESSION: Single live intrauterine pregnancy with gestational age of [redacted] weeks and 1 day. Electronically Signed   By: Iven Finn M.D.   On: 10/14/2022 15:14    MDM Wet Prep and GC/CT Labs: UA, UPT, CBC, hCG, ABO Ultrasound Assessment and Plan  21 year old [id number] at Unknown GA Abdominal Pain Vaginal Spotting  -Reviewed POC with patient. -Cultures collected by self-swab.  -Patient without current abdominal pain. No medications offered. -Labs ordered -Send for [place] and await results.   Maryann Conners 10/14/2022, 1:38 PM   Reassessment (3:46 PM)  -Patient continues to report no pain. -States spotting with wiping. -Reassured that some spotting is normal after some activities such as sex and passing of hard stools.  -Reviewed [place] and lab findings. -BP elevated. -Discussed starting iron supplement QOD and Procardia 77m day.  Patient agreeable.  -Given list of community providers and instructed to initiate care. -Informed that dating UKoreacanceled. But should consider repeat UKoreain 2 weeks at new practice as fetal HR was low, but normal considering GA. -Patient verbalizes understanding and without questions. -Precautions reviewed. -Discharged to home in stable condition.  JMaryann ConnersMSN, CNM Advanced Practice Provider, [facility] for [employer]

## 2022-10-15 ENCOUNTER — Other Ambulatory Visit: Payer: Medicaid Other

## 2022-10-15 LAB — GC/CHLAMYDIA PROBE AMP (~~LOC~~) NOT AT ARMC
Chlamydia: NEGATIVE
Comment: NEGATIVE
Comment: NORMAL
Neisseria Gonorrhea: NEGATIVE

## 2022-10-16 LAB — CULTURE, OB URINE: Culture: >=100000 — AB

## 2022-10-17 ENCOUNTER — Encounter: Payer: Self-pay | Admitting: Advanced Practice Midwife

## 2022-10-17 ENCOUNTER — Other Ambulatory Visit: Payer: Self-pay | Admitting: Advanced Practice Midwife

## 2022-10-17 DIAGNOSIS — O2341 Unspecified infection of urinary tract in pregnancy, first trimester: Secondary | ICD-10-CM

## 2022-10-17 MED ORDER — CEFADROXIL 500 MG PO CAPS: 500.0000 mg | ORAL_CAPSULE | Freq: Two times a day (BID) | ORAL | 0 refills | Status: DC

## 2022-10-17 NOTE — Progress Notes (Signed)
+   uti per culture collected in MAU. Will notify patient via active MyChart account.  Mallie Snooks, [place], MSN, CNM Certified Nurse Midwife, Product/process development scientist for [employer], [place]

## 2022-12-29 ENCOUNTER — Inpatient Hospital Stay (HOSPITAL_COMMUNITY)
Admission: AD | Admit: 2022-12-29 | Discharge: 2022-12-30 | Disposition: A | Discharge status: Home / Self Care | Payer: Medicaid Other | Attending: Obstetrics and Gynecology | Admitting: Obstetrics and Gynecology

## 2022-12-29 ENCOUNTER — Encounter (HOSPITAL_COMMUNITY): Payer: Self-pay | Admitting: Obstetrics and Gynecology

## 2022-12-29 DIAGNOSIS — A5901 Trichomonal vulvovaginitis: Secondary | ICD-10-CM | POA: Insufficient documentation

## 2022-12-29 DIAGNOSIS — O2342 Unspecified infection of urinary tract in pregnancy, second trimester: Secondary | ICD-10-CM

## 2022-12-29 DIAGNOSIS — O99012 Anemia complicating pregnancy, second trimester: Secondary | ICD-10-CM | POA: Diagnosis not present

## 2022-12-29 DIAGNOSIS — R109 Unspecified abdominal pain: Secondary | ICD-10-CM

## 2022-12-29 DIAGNOSIS — O23592 Infection of other part of genital tract in pregnancy, second trimester: Secondary | ICD-10-CM | POA: Diagnosis not present

## 2022-12-29 DIAGNOSIS — O99891 Other specified diseases and conditions complicating pregnancy: Secondary | ICD-10-CM

## 2022-12-29 DIAGNOSIS — O26892 Other specified pregnancy related conditions, second trimester: Secondary | ICD-10-CM | POA: Diagnosis not present

## 2022-12-29 DIAGNOSIS — O98312 Other infections with a predominantly sexual mode of transmission complicating pregnancy, second trimester: Secondary | ICD-10-CM | POA: Diagnosis not present

## 2022-12-29 DIAGNOSIS — M549 Dorsalgia, unspecified: Secondary | ICD-10-CM | POA: Diagnosis not present

## 2022-12-29 DIAGNOSIS — Z3A17 17 weeks gestation of pregnancy: Secondary | ICD-10-CM | POA: Diagnosis not present

## 2022-12-29 DIAGNOSIS — O10919 Unspecified pre-existing hypertension complicating pregnancy, unspecified trimester: Secondary | ICD-10-CM

## 2022-12-29 DIAGNOSIS — O10912 Unspecified pre-existing hypertension complicating pregnancy, second trimester: Secondary | ICD-10-CM | POA: Insufficient documentation

## 2022-12-29 DIAGNOSIS — O26899 Other specified pregnancy related conditions, unspecified trimester: Secondary | ICD-10-CM

## 2022-12-29 LAB — URINALYSIS, ROUTINE W REFLEX MICROSCOPIC
Bilirubin Urine: NEGATIVE
Glucose, UA: NEGATIVE mg/dL
Ketones, ur: NEGATIVE mg/dL
Nitrite: POSITIVE — AB
Protein, ur: 100 mg/dL — AB
RBC / HPF: >50 RBC/hpf — ABNORMAL HIGH (ref 0–5)
Specific Gravity, Urine: 1.014 (ref 1.005–1.030)
WBC, UA: >50 WBC/hpf — ABNORMAL HIGH (ref 0–5)
pH: 5 (ref 5.0–8.0)

## 2022-12-29 LAB — PROTEIN / CREATININE RATIO, URINE
Creatinine, Urine: 106 mg/dL
Protein Creatinine Ratio: 0.98 mg/mg{Cre} — ABNORMAL HIGH (ref 0.00–0.15)
Total Protein, Urine: 104 mg/dL

## 2022-12-29 LAB — CBC
HCT: 30.2 % — ABNORMAL LOW (ref 36.0–46.0)
Hemoglobin: 9.2 g/dL — ABNORMAL LOW (ref 12.0–15.0)
MCH: 20.6 pg — ABNORMAL LOW (ref 26.0–34.0)
MCHC: 30.5 g/dL (ref 30.0–36.0)
MCV: 67.7 fL — ABNORMAL LOW (ref 80.0–100.0)
Platelets: 174 10*3/uL (ref 150–400)
RBC: 4.46 MIL/uL (ref 3.87–5.11)
RDW: 17.7 % — ABNORMAL HIGH (ref 11.5–15.5)
WBC: 7.5 10*3/uL (ref 4.0–10.5)
nRBC: 0 % (ref 0.0–0.2)

## 2022-12-29 LAB — COMPREHENSIVE METABOLIC PANEL
ALT: 8 U/L (ref 0–44)
AST: 12 U/L — ABNORMAL LOW (ref 15–41)
Albumin: 3.3 g/dL — ABNORMAL LOW (ref 3.5–5.0)
Alkaline Phosphatase: 55 U/L (ref 38–126)
Anion gap: 7 (ref 5–15)
BUN: 5 mg/dL — ABNORMAL LOW (ref 6–20)
CO2: 23 mmol/L (ref 22–32)
Calcium: 9.5 mg/dL (ref 8.9–10.3)
Chloride: 104 mmol/L (ref 98–111)
Creatinine, Ser: 0.51 mg/dL (ref 0.44–1.00)
GFR, Estimated: >60 mL/min (ref >60)
Glucose, Bld: 107 mg/dL — ABNORMAL HIGH (ref 70–99)
Potassium: 3.5 mmol/L (ref 3.5–5.1)
Sodium: 134 mmol/L — ABNORMAL LOW (ref 135–145)
Total Bilirubin: 0.3 mg/dL (ref 0.3–1.2)
Total Protein: 7 g/dL (ref 6.5–8.1)

## 2022-12-29 LAB — WET PREP, GENITAL
Clue Cells Wet Prep HPF POC: NONE SEEN
Sperm: NONE SEEN
WBC, Wet Prep HPF POC: >=10 — AB (ref <10)
Yeast Wet Prep HPF POC: NONE SEEN

## 2022-12-29 MED ORDER — CEFADROXIL 500 MG PO CAPS: 500.0000 mg | ORAL_CAPSULE | Freq: Two times a day (BID) | ORAL | 0 refills | Status: DC

## 2022-12-29 MED ORDER — METRONIDAZOLE 500 MG PO TABS
2000.0000 mg | ORAL_TABLET | Freq: Once | ORAL | Status: AC
  Administered 2022-12-29: 2000 mg via ORAL
  Filled 2022-12-29: qty 4

## 2022-12-29 MED ORDER — DOCUSATE SODIUM 250 MG PO CAPS: 250.0000 mg | ORAL_CAPSULE | Freq: Every day | ORAL | 0 refills | Status: DC

## 2022-12-29 MED ORDER — ACETAMINOPHEN 500 MG PO TABS
1000.0000 mg | ORAL_TABLET | Freq: Once | ORAL | Status: AC
  Administered 2022-12-29: 1000 mg via ORAL
  Filled 2022-12-29: qty 2

## 2022-12-29 MED ORDER — CYCLOBENZAPRINE HCL 10 MG PO TABS: 10.0000 mg | ORAL_TABLET | Freq: Two times a day (BID) | ORAL | 0 refills | Status: DC | PRN

## 2022-12-29 MED ORDER — NIFEDIPINE ER OSMOTIC RELEASE 30 MG PO TB24: 30.0000 mg | ORAL_TABLET | Freq: Every day | ORAL | 2 refills | Status: DC

## 2022-12-29 MED ORDER — FERROUS SULFATE 325 (65 FE) MG PO TBEC: 325.0000 mg | DELAYED_RELEASE_TABLET | ORAL | 3 refills | Status: DC

## 2022-12-29 MED ORDER — NIFEDIPINE 10 MG PO CAPS
10.0000 mg | ORAL_CAPSULE | Freq: Once | ORAL | Status: AC
  Administered 2022-12-29: 10 mg via ORAL
  Filled 2022-12-29: qty 1

## 2022-12-29 NOTE — MAU Note (Signed)
Anna Welch is a 22 y.o. at [redacted]w[redacted]d here in MAU reporting: by EMS for abdominal and back pain that started last night. Pt states the pain got stronger tonight around 2100. Pt states the pain is constant and is worse with movement. Pt has not received PNC yet but has an appt to go 01/07/23. Pt denies any LOF or vaginal bleeding.   Onset of complaint: 12/28/22 at night  Pain score: 10/10 lower back  7/10 lower abdomen  Vitals:   12/29/22 2214  BP: (!) 162/80  Pulse: 98  Resp: 20  Temp: 99 F (37.2 C)  SpO2: 100%     FHT:148 Lab orders placed from triage:  UA

## 2022-12-29 NOTE — Discharge Instructions (Signed)

## 2022-12-29 NOTE — MAU Provider Note (Signed)
History     CSN: [id number]  Arrival date and time: 12/29/22 2158   None     Chief Complaint  Patient presents with   Abdominal Pain   Back Pain   HPI Anna Welch is a 22 y.o. [id number] at [redacted]w[redacted]d who presents to MAU via EMS with reports of lower back and abdominal pain. She reports pain started last night however worsened today. Nothing makes pain better however walking and movement makes the pain worse. She reports a similar pain in her previous pregnancies. She denies vaginal bleeding, discharge, itching, odor, or urinary s/s. She reports a history of HBP however was unaware of the Procardia prescription that was sent to her pharmacy back in October so she has not been taking anything for her BP's.   Patient has her first appointment scheduled at [facility] on 1/16.   OB History     Gravida  4   Para  2   Term  2   Preterm      AB  1   Living  2      SAB  1   IAB      Ectopic      Multiple      Live Births  2           Past Medical History:  Diagnosis Date   Acid reflux    Anemia    Hypertension     Past Surgical History:  Procedure Laterality Date   CESAREAN SECTION     MULTIPLE TOOTH EXTRACTIONS     TYMPANOSTOMY TUBE PLACEMENT      Family History  Problem Relation Age of Onset   Hypertension Mother    Stroke Father    Hypertension Father     Social History   Tobacco Use   Smoking status: Never   Smokeless tobacco: Never  Vaping Use   Vaping Use: Former  Substance Use Topics   Alcohol use: Not Currently   Drug use: Not Currently    Allergies:  Allergies  Allergen Reactions   Nitrofurantoin Rash    No medications prior to admission.   Review of Systems  Constitutional: Negative.   Respiratory: Negative.    Cardiovascular: Negative.   Gastrointestinal:  Positive for abdominal pain.  Genitourinary: Negative.   Musculoskeletal:  Positive for back pain.  Neurological: Negative.    Physical Exam  Patient Vitals for the past  24 hrs:  BP Temp Temp src Pulse Resp SpO2  12/29/22 2355 (!) 153/82 -- -- -- -- --  12/29/22 2316 (!) 144/68 -- -- 82 -- --  12/29/22 2301 (!) 147/70 -- -- 79 -- --  12/29/22 2255 (!) 157/70 -- -- 81 -- 100 %  12/29/22 2232 (!) 149/81 -- -- 90 -- 100 %  12/29/22 2217 (!) 152/79 -- -- 84 -- 100 %  12/29/22 2214 (!) 162/80 99 F (37.2 C) Oral 98 20 100 %   Physical Exam Vitals and nursing note reviewed. Exam conducted with a chaperone present.  Constitutional:      General: She is not in acute distress.    Appearance: She is obese.  Eyes:     Extraocular Movements: Extraocular movements intact.     Pupils: Pupils are equal, round, and reactive to light.  Cardiovascular:     Rate and Rhythm: Normal rate.  Pulmonary:     Effort: Pulmonary effort is normal.  Abdominal:     Palpations: Abdomen is soft.     Tenderness: There is  no abdominal tenderness. There is no right CVA tenderness or left CVA tenderness.  Genitourinary:    Comments: Blind swabs collected Musculoskeletal:        General: Normal range of motion.     Cervical back: Normal range of motion.  Skin:    General: Skin is warm and dry.  Neurological:     General: No focal deficit present.     Mental Status: She is alert and oriented to person, place, and time.  Psychiatric:        Mood and Affect: Mood normal.        Behavior: Behavior normal.    Dilation: Closed Exam by:: Maryagnes Amos, CNM  Results for orders placed or performed during the hospital encounter of 12/29/22 (from the past 24 hour(s))  CBC     Status: Abnormal   Collection Time: 12/29/22 10:32 PM  Result Value Ref Range   WBC 7.5 4.0 - 10.5 K/uL   RBC 4.46 3.87 - 5.11 MIL/uL   Hemoglobin 9.2 (L) 12.0 - 15.0 g/dL   HCT 30.2 (L) 36.0 - 46.0 %   MCV 67.7 (L) 80.0 - 100.0 fL   MCH 20.6 (L) 26.0 - 34.0 pg   MCHC 30.5 30.0 - 36.0 g/dL   RDW 17.7 (H) 11.5 - 15.5 %   Platelets 174 150 - 400 K/uL   nRBC 0.0 0.0 - 0.2 %  Comprehensive metabolic  panel     Status: Abnormal   Collection Time: 12/29/22 10:32 PM  Result Value Ref Range   Sodium 134 (L) 135 - 145 mmol/L   Potassium 3.5 3.5 - 5.1 mmol/L   Chloride 104 98 - 111 mmol/L   CO2 23 22 - 32 mmol/L   Glucose, Bld 107 (H) 70 - 99 mg/dL   BUN 5 (L) 6 - 20 mg/dL   Creatinine, Ser 0.51 0.44 - 1.00 mg/dL   Calcium 9.5 8.9 - 10.3 mg/dL   Total Protein 7.0 6.5 - 8.1 g/dL   Albumin 3.3 (L) 3.5 - 5.0 g/dL   AST 12 (L) 15 - 41 U/L   ALT 8 0 - 44 U/L   Alkaline Phosphatase 55 38 - 126 U/L   Total Bilirubin 0.3 0.3 - 1.2 mg/dL   GFR, Estimated >60 >60 mL/min   Anion gap 7 5 - 15  Urinalysis, Routine w reflex microscopic     Status: Abnormal   Collection Time: 12/29/22 10:48 PM  Result Value Ref Range   Color, Urine YELLOW YELLOW   APPearance HAZY (A) CLEAR   Specific Gravity, Urine 1.014 1.005 - 1.030   pH 5.0 5.0 - 8.0   Glucose, UA NEGATIVE NEGATIVE mg/dL   Hgb urine dipstick MODERATE (A) NEGATIVE   Bilirubin Urine NEGATIVE NEGATIVE   Ketones, ur NEGATIVE NEGATIVE mg/dL   Protein, ur 100 (A) NEGATIVE mg/dL   Nitrite POSITIVE (A) NEGATIVE   Leukocytes,Ua SMALL (A) NEGATIVE   RBC / HPF >50 (H) 0 - 5 RBC/hpf   WBC, UA >50 (H) 0 - 5 WBC/hpf   Bacteria, UA MANY (A) NONE SEEN   Squamous Epithelial / HPF 0-5 0 - 5 /HPF   Mucus PRESENT   Protein / creatinine ratio, urine     Status: Abnormal   Collection Time: 12/29/22 10:48 PM  Result Value Ref Range   Creatinine, Urine 106 mg/dL   Total Protein, Urine 104 mg/dL   Protein Creatinine Ratio 0.98 (H) 0.00 - 0.15 mg/mg[Cre]  Wet prep, genital  Status: Abnormal   Collection Time: 12/29/22 10:48 PM  Result Value Ref Range   Yeast Wet Prep HPF POC NONE SEEN NONE SEEN   Trich, Wet Prep PRESENT (A) NONE SEEN   Clue Cells Wet Prep HPF POC NONE SEEN NONE SEEN   WBC, Wet Prep HPF POC >=10 (A) <10   Sperm NONE SEEN     FHR: 148 bpm via doppler  MAU Course  Procedures  MDM UA, CBC, CMP, UPCR Wet prep, GC/CT Tylenol  PO Procardia IR  UA positive for hgb, nitrites, and many bacteria. Will order urine culture and treat for UTI.   Given that patient has not started [facility] at this time, baseline labs obtained given cHTN. She was also given a dose of Procardia IR. CBC significant for anemia. Hgb 9.2 which is consistent with previous results. CMP unremarkable. UPCR elevated at 0.98. Likely elevated in the setting of UTI so plan to repeat at [facility]. Will also resend prescription for Procardia and iron supplement. Will also send stool softner per patient request as iron makes her constipated  Wet prep positive for trichomonas. Will give 2g Flagyl here. Patient instructed that partner(s) need to be treated as well and to abstain from IC for at least 7 days after partner(s) have been treated. Partners may go to HD or PCP for treatment. GC/CT pending.   Tylenol PO for pain was given. She was offered Flexeril as well but prefers prescription to be sent home as she will be taking an [place] home and does not feel comfortable taking it. Tylenol did improve pain significantly.   At this time, I have a low suspicion for pyelonephritis or preterm labor. Pain likely caused from infectious process and/or MSK/round ligament pain. Will also send Flexeril prn.    Assessment and Plan  [redacted] weeks gestation of pregnancy Abdominal pain affecting pregnancy Back pain affecting pregnancy Trichomonal vaginitis during pregnancy in second trimester UTI in pregnancy Chronic hypertension affecting pregnancy Anemia affecting pregnancy  - Discharge home in stable condition - Rx for Procardia, Duricef, Iron, Flexeril and Colace sent - Return precautions reviewed. Return to MAU as needed for new/worsening symptoms - Keep NOB as scheduled on 1/16   Brand Males, CNM 12/30/2022, 1:09 AM

## 2022-12-29 NOTE — MAU Provider Note (Incomplete)
History     CSN: [id number]  Arrival date and time: 12/29/22 2158   None     Chief Complaint  Patient presents with  . Abdominal Pain  . Back Pain   HPI Anna Welch is a 22 y.o. [id number] at [redacted]w[redacted]d who presents to MAU via EMS with reports of lower back and abdominal pain. She reports pain started last night however worsened today. Nothing makes pain better however walking and movement makes the pain worse. She reports a similar pain in her previous pregnancies. She denies vaginal bleeding, discharge, itching, odor, or urinary s/s. She reports a history of HBP however was unaware of the Procardia prescription that was sent to her pharmacy back in October so she has not been taking anything for her BP's.   Patient has her first appointment scheduled at [facility] on 1/16.   OB History     Gravida  4   Para  2   Term  2   Preterm      AB  1   Living  2      SAB  1   IAB      Ectopic      Multiple      Live Births  2           Past Medical History:  Diagnosis Date  . Acid reflux   . Anemia   . Hypertension     Past Surgical History:  Procedure Laterality Date  . CESAREAN SECTION    . MULTIPLE TOOTH EXTRACTIONS    . TYMPANOSTOMY TUBE PLACEMENT      Family History  Problem Relation Age of Onset  . Hypertension Mother   . Stroke Father   . Hypertension Father     Social History   Tobacco Use  . Smoking status: Never  . Smokeless tobacco: Never  Vaping Use  . Vaping Use: Former  Substance Use Topics  . Alcohol use: Not Currently  . Drug use: Not Currently    Allergies:  Allergies  Allergen Reactions  . Nitrofurantoin Rash    Medications Prior to Admission  Medication Sig Dispense Refill Last Dose  . Prenatal Vit-Fe Fumarate-FA (PREPLUS) 27-1 MG TABS Take 1 tablet by mouth daily. 90 tablet 2 12/29/2022  . AMBULATORY NON FORMULARY MEDICATION 1 Device by Other route once a week. Blood Pressure Cuff/ Medium Monitored Regularly at home ICD 10:  O09.90 1 kit 0   . [DISCONTINUED] cefadroxil (DURICEF) 500 MG capsule Take 1 capsule (500 mg total) by mouth 2 (two) times daily. 14 capsule 0   . [DISCONTINUED] ferrous sulfate 325 (65 FE) MG EC tablet Take 1 tablet (325 mg total) by mouth every other day. 45 tablet 3   . [DISCONTINUED] NIFEdipine (PROCARDIA-XL/NIFEDICAL-XL) 30 MG 24 hr tablet Take 1 tablet (30 mg total) by mouth daily. 60 tablet 2    Review of Systems  Constitutional: Negative.   Respiratory: Negative.    Cardiovascular: Negative.   Gastrointestinal:  Positive for abdominal pain.  Genitourinary: Negative.   Musculoskeletal:  Positive for back pain.  Neurological: Negative.    Physical Exam  Patient Vitals for the past 24 hrs:  BP Temp Temp src Pulse Resp SpO2  12/29/22 2316 (!) 144/68 -- -- 82 -- --  12/29/22 2301 (!) 147/70 -- -- 79 -- --  12/29/22 2255 (!) 157/70 -- -- 81 -- 100 %  12/29/22 2232 (!) 149/81 -- -- 90 -- 100 %  12/29/22 2217 (!) 152/79 -- --  84 -- 100 %  12/29/22 2214 (!) 162/80 99 F (37.2 C) Oral 98 20 100 %   Physical Exam Vitals and nursing note reviewed. Exam conducted with a chaperone present.  Constitutional:      General: She is not in acute distress.    Appearance: She is obese.  Eyes:     Extraocular Movements: Extraocular movements intact.     Pupils: Pupils are equal, round, and reactive to light.  Cardiovascular:     Rate and Rhythm: Normal rate.  Pulmonary:     Effort: Pulmonary effort is normal.  Abdominal:     Palpations: Abdomen is soft.     Tenderness: There is no abdominal tenderness. There is no right CVA tenderness or left CVA tenderness.  Genitourinary:    Comments: Blind swabs collected Musculoskeletal:        General: Normal range of motion.     Cervical back: Normal range of motion.  Skin:    General: Skin is warm and dry.  Neurological:     General: No focal deficit present.     Mental Status: She is alert and oriented to person, place, and time.   Psychiatric:        Mood and Affect: Mood normal.        Behavior: Behavior normal.    Dilation: Closed Exam by:: Maryagnes Amos, CNM  Results for orders placed or performed during the hospital encounter of 12/29/22 (from the past 24 hour(s))  CBC     Status: Abnormal   Collection Time: 12/29/22 10:32 PM  Result Value Ref Range   WBC 7.5 4.0 - 10.5 K/uL   RBC 4.46 3.87 - 5.11 MIL/uL   Hemoglobin 9.2 (L) 12.0 - 15.0 g/dL   HCT 30.2 (L) 36.0 - 46.0 %   MCV 67.7 (L) 80.0 - 100.0 fL   MCH 20.6 (L) 26.0 - 34.0 pg   MCHC 30.5 30.0 - 36.0 g/dL   RDW 17.7 (H) 11.5 - 15.5 %   Platelets 174 150 - 400 K/uL   nRBC 0.0 0.0 - 0.2 %  Comprehensive metabolic panel     Status: Abnormal   Collection Time: 12/29/22 10:32 PM  Result Value Ref Range   Sodium 134 (L) 135 - 145 mmol/L   Potassium 3.5 3.5 - 5.1 mmol/L   Chloride 104 98 - 111 mmol/L   CO2 23 22 - 32 mmol/L   Glucose, Bld 107 (H) 70 - 99 mg/dL   BUN 5 (L) 6 - 20 mg/dL   Creatinine, Ser 0.51 0.44 - 1.00 mg/dL   Calcium 9.5 8.9 - 10.3 mg/dL   Total Protein 7.0 6.5 - 8.1 g/dL   Albumin 3.3 (L) 3.5 - 5.0 g/dL   AST 12 (L) 15 - 41 U/L   ALT 8 0 - 44 U/L   Alkaline Phosphatase 55 38 - 126 U/L   Total Bilirubin 0.3 0.3 - 1.2 mg/dL   GFR, Estimated >60 >60 mL/min   Anion gap 7 5 - 15  Urinalysis, Routine w reflex microscopic     Status: Abnormal   Collection Time: 12/29/22 10:48 PM  Result Value Ref Range   Color, Urine YELLOW YELLOW   APPearance HAZY (A) CLEAR   Specific Gravity, Urine 1.014 1.005 - 1.030   pH 5.0 5.0 - 8.0   Glucose, UA NEGATIVE NEGATIVE mg/dL   Hgb urine dipstick MODERATE (A) NEGATIVE   Bilirubin Urine NEGATIVE NEGATIVE   Ketones, ur NEGATIVE NEGATIVE mg/dL   Protein,  ur 100 (A) NEGATIVE mg/dL   Nitrite POSITIVE (A) NEGATIVE   Leukocytes,Ua SMALL (A) NEGATIVE   RBC / HPF >50 (H) 0 - 5 RBC/hpf   WBC, UA >50 (H) 0 - 5 WBC/hpf   Bacteria, UA MANY (A) NONE SEEN   Squamous Epithelial / HPF 0-5 0 - 5 /HPF    Mucus PRESENT   Protein / creatinine ratio, urine     Status: Abnormal   Collection Time: 12/29/22 10:48 PM  Result Value Ref Range   Creatinine, Urine 106 mg/dL   Total Protein, Urine 104 mg/dL   Protein Creatinine Ratio 0.98 (H) 0.00 - 0.15 mg/mg[Cre]  Wet prep, genital     Status: Abnormal   Collection Time: 12/29/22 10:48 PM  Result Value Ref Range   Yeast Wet Prep HPF POC NONE SEEN NONE SEEN   Trich, Wet Prep PRESENT (A) NONE SEEN   Clue Cells Wet Prep HPF POC NONE SEEN NONE SEEN   WBC, Wet Prep HPF POC >=10 (A) <10   Sperm NONE SEEN     FHR: 148 bpm via doppler  MAU Course  Procedures  MDM UA, CBC, CMP, UPCR Wet prep, GC/CT Tylenol PO Procardia IR  Given that patient has not started [facility] at this time, baseline labs obtained given cHTN. She was also given a dose of Procardia IR. Tylenol PO for pain was given. She was offered Flexeril as well but prefers prescription to be sent home as she will be taking an [place] home and does not feel comfortable taking it.  ***  Assessment and Plan  ***  Brand Males, CNM 12/29/2022, 11:44 PM

## 2022-12-30 LAB — GC/CHLAMYDIA PROBE AMP (~~LOC~~) NOT AT ARMC
Chlamydia: NEGATIVE
Comment: NEGATIVE
Comment: NORMAL
Neisseria Gonorrhea: NEGATIVE

## 2022-12-31 ENCOUNTER — Telehealth: Payer: Medicaid Other

## 2023-01-01 LAB — CULTURE, OB URINE: Culture: >=100000 — AB

## 2023-01-07 ENCOUNTER — Encounter: Payer: Self-pay | Admitting: Advanced Practice Midwife

## 2023-02-17 ENCOUNTER — Other Ambulatory Visit: Payer: Self-pay

## 2023-02-17 ENCOUNTER — Encounter: Payer: Self-pay | Admitting: Obstetrics & Gynecology

## 2023-02-17 ENCOUNTER — Ambulatory Visit (INDEPENDENT_AMBULATORY_CARE_PROVIDER_SITE_OTHER): Payer: Medicaid Other | Admitting: Obstetrics & Gynecology

## 2023-02-17 ENCOUNTER — Other Ambulatory Visit (HOSPITAL_COMMUNITY)
Admission: RE | Admit: 2023-02-17 | Discharge: 2023-02-17 | Disposition: A | Discharge status: Home / Self Care | Payer: Medicaid Other | Source: Ambulatory Visit | Attending: Obstetrics & Gynecology | Admitting: Obstetrics & Gynecology

## 2023-02-17 VITALS — BP 132/85 | HR 96 | Wt 327.2 lb

## 2023-02-17 DIAGNOSIS — O0992 Supervision of high risk pregnancy, unspecified, second trimester: Secondary | ICD-10-CM | POA: Diagnosis not present

## 2023-02-17 DIAGNOSIS — O10919 Unspecified pre-existing hypertension complicating pregnancy, unspecified trimester: Secondary | ICD-10-CM

## 2023-02-17 DIAGNOSIS — A5901 Trichomonal vulvovaginitis: Secondary | ICD-10-CM

## 2023-02-17 DIAGNOSIS — O23592 Infection of other part of genital tract in pregnancy, second trimester: Secondary | ICD-10-CM | POA: Diagnosis not present

## 2023-02-17 DIAGNOSIS — O0990 Supervision of high risk pregnancy, unspecified, unspecified trimester: Secondary | ICD-10-CM | POA: Diagnosis present

## 2023-02-17 DIAGNOSIS — O2342 Unspecified infection of urinary tract in pregnancy, second trimester: Secondary | ICD-10-CM

## 2023-02-17 DIAGNOSIS — Z3A24 24 weeks gestation of pregnancy: Secondary | ICD-10-CM | POA: Diagnosis not present

## 2023-02-17 DIAGNOSIS — O10912 Unspecified pre-existing hypertension complicating pregnancy, second trimester: Secondary | ICD-10-CM

## 2023-02-17 DIAGNOSIS — D563 Thalassemia minor: Secondary | ICD-10-CM

## 2023-02-17 NOTE — Progress Notes (Signed)
  Subjective:late PNC, interested in VBAC    Anna Welch is a [id number] seen today for her first obstetrical visit.  [facility]  [facility], previously on Procardia but was dizzy while taking it . Patient does intend to breast feed. Pregnancy history fully reviewed.  Patient reports no complaints.  Vitals:   02/17/23 1002  BP: 132/85  Pulse: 96  Weight: (!) 148.4 kg    HISTORY: OB History  Gravida Para Term Preterm AB Living  4 2 2 $ 0 1 2  SAB IAB Ectopic Multiple Live Births  1 0 0 0 2    # Outcome Date GA Lbr Len/2nd Weight Sex Delivery Anes PTL Lv  4 Current           3 Term 08/09/21    F CS-Unspec  N LIV  2 Term 09/08/19 459w5d M CS-LTranv   LIV  1 SAB 05/2018           Past Medical History:  Diagnosis Date   Acid reflux    Anemia    Hypertension    Past Surgical History:  Procedure Laterality Date   CESAREAN SECTION     MULTIPLE TOOTH EXTRACTIONS     TYMPANOSTOMY TUBE PLACEMENT     Family History  Problem Relation Age of Onset   Hypertension Mother    Stroke Father    Hypertension Father      Exam    Uterus:   24 cm  Pelvic Exam:    Perineum: deferred   Vulva:    Vagina:     pH:        Adnexa:    Bony Pelvis:   System: Breast:  normal appearance, no masses or tenderness   Skin: normal coloration and turgor, no rashes    Neurologic: oriented, normal mood   Extremities: normal strength, tone, and muscle mass   HEENT PERRLA, extra ocular movement intact, and sclera clear, anicteric   Mouth/Teeth mucous membranes moist, pharynx normal without lesions   Neck supple   Cardiovascular: regular rate and rhythm   Respiratory:  appears well, vitals normal, no respiratory distress, acyanotic, normal RR   Abdomen: soft, non-tender; bowel sounds normal; no masses,  no organomegaly   Urinary:       Assessment:    Pregnancy: [id number] Active Problem List   Diagnosis Date Noted    Supervision of high risk pregnancy, antepartum 02/17/2023   Trichomonal vaginitis during pregnancy in second trimester 12/29/2022   Anemia in pregnancy 06/02/2019   Chronic hypertension affecting pregnancy 05/19/2019   UTI (urinary tract infection) during pregnancy 05/19/2019        Plan:     Initial labs drawn. Prenatal vitamins. Problem list reviewed and updated. Genetic Screening discussed : ordered.  Ultrasound discussed; fetal survey: ordered.  Follow up in 4 weeks. 50% of 30 min visit spent on counseling and coordination of care.  VBAC discussed and she will review the consent form to sign later   JaEmeterio Reeve/26/2024

## 2023-02-18 LAB — CERVICOVAGINAL ANCILLARY ONLY
Chlamydia: NEGATIVE
Comment: NEGATIVE
Comment: NEGATIVE
Comment: NORMAL
Neisseria Gonorrhea: NEGATIVE
Trichomonas: POSITIVE — AB

## 2023-02-18 LAB — PROTEIN / CREATININE RATIO, URINE
Creatinine, Urine: 273 mg/dL
Protein, Ur: 29.9 mg/dL
Protein/Creat Ratio: 110 mg/g creat (ref 0–200)

## 2023-02-19 ENCOUNTER — Encounter: Payer: Self-pay | Admitting: *Deleted

## 2023-02-23 LAB — CBC/D/PLT+RPR+RH+ABO+RUBIGG...
Antibody Screen: NEGATIVE
Basophils Absolute: 0 10*3/uL (ref 0.0–0.2)
Basos: 1 %
EOS (ABSOLUTE): 0.1 10*3/uL (ref 0.0–0.4)
Eos: 1 %
HCV Ab: NONREACTIVE
HIV Screen 4th Generation wRfx: REACTIVE
Hematocrit: 28.5 % — ABNORMAL LOW (ref 34.0–46.6)
Hemoglobin: 8.7 g/dL — ABNORMAL LOW (ref 11.1–15.9)
Hepatitis B Surface Ag: NEGATIVE
Immature Grans (Abs): 0 10*3/uL (ref 0.0–0.1)
Immature Granulocytes: 1 %
Lymphocytes Absolute: 1.4 10*3/uL (ref 0.7–3.1)
Lymphs: 21 %
MCH: 20.2 pg — ABNORMAL LOW (ref 26.6–33.0)
MCHC: 30.5 g/dL — ABNORMAL LOW (ref 31.5–35.7)
MCV: 66 fL — ABNORMAL LOW (ref 79–97)
Monocytes Absolute: 0.5 10*3/uL (ref 0.1–0.9)
Monocytes: 8 %
Neutrophils Absolute: 4.4 10*3/uL (ref 1.4–7.0)
Neutrophils: 68 %
Platelets: 216 10*3/uL (ref 150–450)
RBC: 4.3 x10E6/uL (ref 3.77–5.28)
RDW: 17.5 % — ABNORMAL HIGH (ref 11.7–15.4)
RPR Ser Ql: NONREACTIVE
Rh Factor: POSITIVE
Rubella Antibodies, IGG: 2.67 index (ref >0.99)
WBC: 6.4 10*3/uL (ref 3.4–10.8)

## 2023-02-23 LAB — PANORAMA PRENATAL TEST FULL PANEL:PANORAMA TEST PLUS 5 ADDITIONAL MICRODELETIONS: FETAL FRACTION: 8.6

## 2023-02-23 LAB — HIV 1/2 AB DIFFERENTIATION
HIV 1 Ab: NONREACTIVE
HIV 2 Ab: NONREACTIVE
NOTE (HIV CONF MULTIP: NEGATIVE

## 2023-02-23 LAB — HEMOGLOBIN A1C
Est. average glucose Bld gHb Est-mCnc: 114 mg/dL
Hgb A1c MFr Bld: 5.6 % (ref 4.8–5.6)

## 2023-02-23 LAB — HIV-1/HIV-2 QUALITATIVE RNA
Final Interpretation: NEGATIVE
HIV-1 RNA, Qualitative: NONREACTIVE
HIV-2 RNA, Qualitative: NONREACTIVE

## 2023-02-23 LAB — HCV INTERPRETATION

## 2023-02-24 MED ORDER — METRONIDAZOLE 500 MG PO TABS: ORAL_TABLET | ORAL | 0 refills | Status: DC

## 2023-02-24 NOTE — Addendum Note (Signed)
Addended by: Woodroe Mode on: 02/24/2023 09:20 AM   Modules accepted: Orders

## 2023-02-25 LAB — CULTURE, OB URINE

## 2023-02-25 LAB — URINE CULTURE, OB REFLEX

## 2023-02-25 MED ORDER — SULFAMETHOXAZOLE-TRIMETHOPRIM 800-160 MG PO TABS: 1.0000 | ORAL_TABLET | Freq: Two times a day (BID) | ORAL | 1 refills | Status: DC

## 2023-02-25 NOTE — Addendum Note (Signed)
Addended by: Woodroe Mode on: 02/25/2023 05:38 PM   Modules accepted: Orders

## 2023-02-26 LAB — HORIZON CUSTOM: REPORT SUMMARY: POSITIVE — AB

## 2023-02-27 ENCOUNTER — Telehealth: Payer: Self-pay | Admitting: Lactation Services

## 2023-02-27 MED ORDER — METRONIDAZOLE 500 MG PO TABS: 500.0000 mg | ORAL_TABLET | Freq: Two times a day (BID) | ORAL | 0 refills | Status: DC

## 2023-02-27 NOTE — Telephone Encounter (Signed)
Called patient with results of [employer] Carrier Screening showing that patient is a carrier for Alpha Thalassemia.   Patient has already spoke with [employer] in regards to results. They did inform her that partner testing is recommended and that [facility] Partner testing kits are available in the office at her next visit.   Patient questioned where her last prescriptions were sent. Updated Flagyl RX to take for 7 days. Informed patient partner needs to be treated by PCP or [facility] Department. Advised no sexual intercourse for 2 weeks after both partners are treated.   Patient with no questions or concerns. Will call with any questions or concerns as needed.

## 2023-02-28 DIAGNOSIS — D563 Thalassemia minor: Secondary | ICD-10-CM | POA: Insufficient documentation

## 2023-03-04 ENCOUNTER — Encounter: Payer: Self-pay | Admitting: *Deleted

## 2023-03-04 ENCOUNTER — Ambulatory Visit: Payer: Commercial Managed Care - HMO | Admitting: *Deleted

## 2023-03-04 ENCOUNTER — Ambulatory Visit: Payer: Commercial Managed Care - HMO | Attending: Obstetrics & Gynecology

## 2023-03-04 ENCOUNTER — Other Ambulatory Visit: Payer: Self-pay | Admitting: *Deleted

## 2023-03-04 VITALS — BP 146/86 | HR 92

## 2023-03-04 DIAGNOSIS — O10012 Pre-existing essential hypertension complicating pregnancy, second trimester: Secondary | ICD-10-CM | POA: Diagnosis not present

## 2023-03-04 DIAGNOSIS — O99212 Obesity complicating pregnancy, second trimester: Secondary | ICD-10-CM | POA: Diagnosis not present

## 2023-03-04 DIAGNOSIS — E669 Obesity, unspecified: Secondary | ICD-10-CM

## 2023-03-04 DIAGNOSIS — O10919 Unspecified pre-existing hypertension complicating pregnancy, unspecified trimester: Secondary | ICD-10-CM | POA: Diagnosis present

## 2023-03-04 DIAGNOSIS — O99213 Obesity complicating pregnancy, third trimester: Secondary | ICD-10-CM

## 2023-03-04 DIAGNOSIS — O0990 Supervision of high risk pregnancy, unspecified, unspecified trimester: Secondary | ICD-10-CM

## 2023-03-04 DIAGNOSIS — O3662X0 Maternal care for excessive fetal growth, second trimester, not applicable or unspecified: Secondary | ICD-10-CM | POA: Diagnosis not present

## 2023-03-04 DIAGNOSIS — D563 Thalassemia minor: Secondary | ICD-10-CM

## 2023-03-04 DIAGNOSIS — Z3A26 26 weeks gestation of pregnancy: Secondary | ICD-10-CM

## 2023-03-04 DIAGNOSIS — O10913 Unspecified pre-existing hypertension complicating pregnancy, third trimester: Secondary | ICD-10-CM

## 2023-03-17 ENCOUNTER — Ambulatory Visit: Payer: Medicaid Other

## 2023-03-17 ENCOUNTER — Other Ambulatory Visit: Payer: Self-pay

## 2023-03-17 ENCOUNTER — Other Ambulatory Visit: Payer: Medicaid Other

## 2023-03-17 DIAGNOSIS — O0990 Supervision of high risk pregnancy, unspecified, unspecified trimester: Secondary | ICD-10-CM

## 2023-03-18 ENCOUNTER — Other Ambulatory Visit: Payer: Self-pay

## 2023-03-18 ENCOUNTER — Other Ambulatory Visit: Payer: Medicaid Other

## 2023-03-18 ENCOUNTER — Ambulatory Visit (INDEPENDENT_AMBULATORY_CARE_PROVIDER_SITE_OTHER): Payer: Medicaid Other | Admitting: Family Medicine

## 2023-03-18 VITALS — BP 144/80 | Wt 335.0 lb

## 2023-03-18 DIAGNOSIS — Z3A28 28 weeks gestation of pregnancy: Secondary | ICD-10-CM

## 2023-03-18 DIAGNOSIS — O0990 Supervision of high risk pregnancy, unspecified, unspecified trimester: Secondary | ICD-10-CM

## 2023-03-18 DIAGNOSIS — O10919 Unspecified pre-existing hypertension complicating pregnancy, unspecified trimester: Secondary | ICD-10-CM

## 2023-03-18 DIAGNOSIS — O10913 Unspecified pre-existing hypertension complicating pregnancy, third trimester: Secondary | ICD-10-CM

## 2023-03-18 DIAGNOSIS — D563 Thalassemia minor: Secondary | ICD-10-CM

## 2023-03-18 DIAGNOSIS — O0993 Supervision of high risk pregnancy, unspecified, third trimester: Secondary | ICD-10-CM

## 2023-03-18 DIAGNOSIS — A5901 Trichomonal vulvovaginitis: Secondary | ICD-10-CM

## 2023-03-18 DIAGNOSIS — O23593 Infection of other part of genital tract in pregnancy, third trimester: Secondary | ICD-10-CM

## 2023-03-18 MED ORDER — METRONIDAZOLE 500 MG PO TABS: 500.0000 mg | ORAL_TABLET | Freq: Two times a day (BID) | ORAL | 0 refills | Status: DC

## 2023-03-18 MED ORDER — CEFADROXIL 500 MG PO CAPS: 500.0000 mg | ORAL_CAPSULE | Freq: Two times a day (BID) | ORAL | 0 refills | Status: AC

## 2023-03-18 NOTE — Progress Notes (Signed)
   PRENATAL VISIT NOTE  Subjective:  Anna Welch is a 22 y.o. [phone] at [redacted]w[redacted]d being seen today for ongoing prenatal care.  She is currently monitored for the following issues for this high-risk pregnancy and has Chronic hypertension affecting pregnancy; UTI (urinary tract infection) during pregnancy; Anemia in pregnancy; Trichomonal vaginitis during pregnancy in second trimester; Supervision of high risk pregnancy, antepartum; and Alpha thalassemia silent carrier on their problem list.  Patient reports no bleeding, no contractions, no cramping, and no leaking.   . Vag. Bleeding: None.  Movement: Present. Denies leaking of fluid.   The following portions of the patient's history were reviewed and updated as appropriate: allergies, current medications, past family history, past medical history, past social history, past surgical history and problem list.   Objective:   Vitals:   03/18/23 0850  BP: (!) 144/80  Weight: (!) 335 lb (152 kg)    Fetal Status: Fetal Heart Rate (bpm): 142   Movement: Present     General:  Alert, oriented and cooperative. Patient is in no acute distress.  Skin: Skin is warm and dry. No rash noted.   Cardiovascular: Normal heart rate noted  Respiratory: Normal respiratory effort, no problems with respiration noted  Abdomen: Soft, gravid, appropriate for gestational age.  Pain/Pressure: Absent     Pelvic: Cervical exam deferred        Extremities: Normal range of motion.     Mental Status: Normal mood and affect. Normal behavior. Normal judgment and thought content.   Assessment and Plan:  Pregnancy: [id number] at [redacted]w[redacted]d 1. Supervision of high risk pregnancy, antepartum Continue routine prenatal care  2. Chronic hypertension affecting pregnancy Blood pressure elevated today.  Patient reports that she does not take her blood pressure medications because she is concerned it makes her blood pressure too low.  When taking her medication her systolic blood  pressures in the 120s to 130s.  Counseled on the importance of taking Procardia.  Patient reports she will start taking it daily.  3. Trichomonal vaginitis during pregnancy in second trimester Has not yet been treated for trichomonal infection.  Prescription sent to wrong pharmacy.  Have now sent prescription to patient's right pharmacy and she will pick it up.  Patient also had a UTI and that prescription was also sent to the same pharmacy.    4. Alpha thalassemia silent carrier  5. [redacted] weeks gestation of pregnancy Patient scheduled for 2-hour GTT today but she ate this morning.  Has been scheduled for glucose tolerance test on 3/28.  Preterm labor symptoms and general obstetric precautions including but not limited to vaginal bleeding, contractions, leaking of fluid and fetal movement were reviewed in detail with the patient. Please refer to After Visit Summary for other counseling recommendations.   No follow-ups on file.  Future Appointments  Date Time Provider [place]  03/20/2023  8:50 AM [facility] LAB [facility]  04/01/2023  3:15 PM [facility] NURSE [facility]  04/01/2023  3:30 PM [facility] US3 [facility]  04/02/2023  3:55 PM Caren Macadam, MD [facility]  04/29/2023  3:15 PM [facility] NURSE [facility]  04/29/2023  3:30 PM [facility]-MFC US2 WMC-MFCUS [place]    Concepcion Living, MD

## 2023-03-20 ENCOUNTER — Other Ambulatory Visit: Payer: Medicaid Other

## 2023-03-20 ENCOUNTER — Other Ambulatory Visit: Payer: Self-pay

## 2023-03-20 DIAGNOSIS — O0990 Supervision of high risk pregnancy, unspecified, unspecified trimester: Secondary | ICD-10-CM

## 2023-04-01 ENCOUNTER — Encounter: Payer: Self-pay | Admitting: *Deleted

## 2023-04-01 ENCOUNTER — Ambulatory Visit: Payer: Commercial Managed Care - HMO | Attending: Maternal & Fetal Medicine

## 2023-04-01 ENCOUNTER — Ambulatory Visit: Payer: Commercial Managed Care - HMO | Admitting: *Deleted

## 2023-04-01 VITALS — BP 154/73 | HR 84

## 2023-04-01 DIAGNOSIS — D563 Thalassemia minor: Secondary | ICD-10-CM

## 2023-04-01 DIAGNOSIS — E669 Obesity, unspecified: Secondary | ICD-10-CM

## 2023-04-01 DIAGNOSIS — O10013 Pre-existing essential hypertension complicating pregnancy, third trimester: Secondary | ICD-10-CM

## 2023-04-01 DIAGNOSIS — O99213 Obesity complicating pregnancy, third trimester: Secondary | ICD-10-CM | POA: Diagnosis not present

## 2023-04-01 DIAGNOSIS — O0990 Supervision of high risk pregnancy, unspecified, unspecified trimester: Secondary | ICD-10-CM

## 2023-04-01 DIAGNOSIS — O10913 Unspecified pre-existing hypertension complicating pregnancy, third trimester: Secondary | ICD-10-CM | POA: Diagnosis present

## 2023-04-01 DIAGNOSIS — Z3A30 30 weeks gestation of pregnancy: Secondary | ICD-10-CM

## 2023-04-02 ENCOUNTER — Other Ambulatory Visit: Payer: Self-pay | Admitting: *Deleted

## 2023-04-02 ENCOUNTER — Encounter: Payer: Medicaid Other | Admitting: Family Medicine

## 2023-04-02 DIAGNOSIS — O10913 Unspecified pre-existing hypertension complicating pregnancy, third trimester: Secondary | ICD-10-CM

## 2023-04-02 DIAGNOSIS — O99213 Obesity complicating pregnancy, third trimester: Secondary | ICD-10-CM

## 2023-04-04 ENCOUNTER — Ambulatory Visit: Payer: Commercial Managed Care - HMO

## 2023-04-15 ENCOUNTER — Encounter: Payer: Self-pay | Admitting: Advanced Practice Midwife

## 2023-04-15 ENCOUNTER — Ambulatory Visit (INDEPENDENT_AMBULATORY_CARE_PROVIDER_SITE_OTHER): Payer: Commercial Managed Care - HMO | Admitting: Advanced Practice Midwife

## 2023-04-15 VITALS — BP 123/79 | HR 102 | Wt 328.5 lb

## 2023-04-15 DIAGNOSIS — O10919 Unspecified pre-existing hypertension complicating pregnancy, unspecified trimester: Secondary | ICD-10-CM

## 2023-04-15 DIAGNOSIS — O0990 Supervision of high risk pregnancy, unspecified, unspecified trimester: Secondary | ICD-10-CM

## 2023-04-15 DIAGNOSIS — Z3A32 32 weeks gestation of pregnancy: Secondary | ICD-10-CM

## 2023-04-15 DIAGNOSIS — Z23 Encounter for immunization: Secondary | ICD-10-CM | POA: Diagnosis not present

## 2023-04-15 DIAGNOSIS — O10913 Unspecified pre-existing hypertension complicating pregnancy, third trimester: Secondary | ICD-10-CM

## 2023-04-15 DIAGNOSIS — O0993 Supervision of high risk pregnancy, unspecified, third trimester: Secondary | ICD-10-CM

## 2023-04-15 NOTE — Progress Notes (Signed)
   PRENATAL VISIT NOTE  Subjective:  Anna Welch is a 22 y.o. [phone] at [redacted]w[redacted]d being seen today for ongoing prenatal care.  She is currently monitored for the following issues for this high-risk pregnancy and has Chronic hypertension affecting pregnancy; UTI (urinary tract infection) during pregnancy; Anemia in pregnancy; Trichomonal vaginitis during pregnancy in second trimester; Supervision of high risk pregnancy, antepartum; and Alpha thalassemia silent carrier on their problem list.  Patient reports no complaints.  Contractions: Irritability. Vag. Bleeding: None.  Movement: Present. Denies leaking of fluid.   The following portions of the patient's history were reviewed and updated as appropriate: allergies, current medications, past family history, past medical history, past social history, past surgical history and problem list.   Objective:   Vitals:   04/15/23 1408  BP: 123/79  Pulse: (!) 102  Weight: (!) 328 lb 8 oz (149 kg)    Fetal Status: Fetal Heart Rate (bpm): 166 Fundal Height: 34 cm Movement: Present     General:  Alert, oriented and cooperative. Patient is in no acute distress.  Skin: Skin is warm and dry. No rash noted.   Cardiovascular: Normal heart rate noted  Respiratory: Normal respiratory effort, no problems with respiration noted  Abdomen: Soft, gravid, appropriate for gestational age.  Pain/Pressure: Present     Pelvic: Cervical exam deferred        Extremities: Normal range of motion.  Edema: None  Mental Status: Normal mood and affect. Normal behavior. Normal judgment and thought content.   Assessment and Plan:  Pregnancy: [id number] at [redacted]w[redacted]d 1. Supervision of high risk pregnancy, antepartum - Tdap vaccine greater than or equal to 7yo IM  2. [redacted] weeks gestation of pregnancy - Patient has not had GTT done at this time. Message sent to front desk to schedule lab only visit ASAP.    3. Chronic hypertension affecting pregnancy - Stable on procardia   XL q day  - Patient saw MFM on 4/9. At that visit BP was elevated, and patient reports that she was not taking the medication daily. Since 4/9 she has been taking procardia  XL daily, every day. BP is normal today. I advised patient that I don't think we need to increase dose at this time, but we will continue to monitor and if BP starts going up even with taking it consistently  we can increase.   Preterm labor symptoms and general obstetric precautions including but not limited to vaginal bleeding, contractions, leaking of fluid and fetal movement were reviewed in detail with the patient. Please refer to After Visit Summary for other counseling recommendations.   Return in about 2 weeks (around 04/29/2023).  Future Appointments  Date Time Provider Department Center  04/16/2023  8:30 AM [facility] NURSE [facility]  04/16/2023  8:45 AM [facility] NST [facility]  04/23/2023 10:30 AM [facility] NURSE [facility]  04/23/2023 10:45 AM [facility] NST [facility]  04/29/2023  9:55 AM Kathlene Cote [facility]  04/29/2023  3:15 PM [facility] NURSE [facility]  04/29/2023  3:30 PM W[facility] US2 [facility]  05/06/2023  7:15 AM [facility] NURSE [facility]  05/06/2023  7:30 AM [facility]-M[facility] US2 [facility]  05/06/2023  8:55 AM [place] Bing, MD [facility]    Thressa Sheller DNP, CNM  04/15/23  2:42 PM

## 2023-04-16 ENCOUNTER — Other Ambulatory Visit: Payer: Commercial Managed Care - HMO

## 2023-04-16 ENCOUNTER — Ambulatory Visit: Payer: Commercial Managed Care - HMO | Attending: Maternal & Fetal Medicine | Admitting: *Deleted

## 2023-04-16 ENCOUNTER — Ambulatory Visit: Payer: Commercial Managed Care - HMO | Admitting: *Deleted

## 2023-04-16 VITALS — BP 156/72 | HR 94

## 2023-04-16 DIAGNOSIS — O10013 Pre-existing essential hypertension complicating pregnancy, third trimester: Secondary | ICD-10-CM | POA: Insufficient documentation

## 2023-04-16 DIAGNOSIS — O0990 Supervision of high risk pregnancy, unspecified, unspecified trimester: Secondary | ICD-10-CM

## 2023-04-16 DIAGNOSIS — Z3A32 32 weeks gestation of pregnancy: Secondary | ICD-10-CM | POA: Insufficient documentation

## 2023-04-16 DIAGNOSIS — O3660X0 Maternal care for excessive fetal growth, unspecified trimester, not applicable or unspecified: Secondary | ICD-10-CM

## 2023-04-16 DIAGNOSIS — O99213 Obesity complicating pregnancy, third trimester: Secondary | ICD-10-CM

## 2023-04-16 DIAGNOSIS — O10913 Unspecified pre-existing hypertension complicating pregnancy, third trimester: Secondary | ICD-10-CM

## 2023-04-16 NOTE — Procedures (Addendum)
Anna Welch 11-04-2001 [redacted]w[redacted]d  Fetus A Non-Stress Test Interpretation for 04/16/23  Indication: Chronic Hypertenstion  Fetal Heart Rate A Mode: External Baseline Rate (A): 140 bpm Variability: Moderate Accelerations: 15 x 15 Decelerations: None Multiple birth?: No  Uterine Activity Mode: Toco Resting Tone Palpated: Relaxed  Interpretation (Fetal Testing) Nonstress Test Interpretation: Reactive Overall Impression: Reassuring for gestational age Comments: Tracing reviewed by Dr. Darra Lis

## 2023-04-17 LAB — GLUCOSE TOLERANCE, 2 HOURS W/ 1HR
Glucose, 1 hour: 132 mg/dL (ref 70–179)
Glucose, 2 hour: 106 mg/dL (ref 70–152)
Glucose, Fasting: 83 mg/dL (ref 70–91)

## 2023-04-17 LAB — RPR: RPR Ser Ql: NONREACTIVE

## 2023-04-17 LAB — HIV ANTIBODY (ROUTINE TESTING W REFLEX): HIV Screen 4th Generation wRfx: NONREACTIVE

## 2023-04-17 NOTE — Progress Notes (Signed)
Alert received on 04/08/2023 through NCNotify system as follows: patient identified as having high BP reading (154/73) on 04/01/2023  Chart review: Blood pressure was rechecked on 04/15/2023  No action needed. Blood pressure recheck was 123/79  Lowry Bowl, [facility] 04/17/2023  4:35 PM

## 2023-04-23 ENCOUNTER — Ambulatory Visit: Payer: Commercial Managed Care - HMO | Attending: Maternal & Fetal Medicine | Admitting: *Deleted

## 2023-04-23 ENCOUNTER — Ambulatory Visit: Payer: Commercial Managed Care - HMO | Admitting: *Deleted

## 2023-04-23 VITALS — BP 142/74 | HR 104

## 2023-04-23 DIAGNOSIS — O3660X0 Maternal care for excessive fetal growth, unspecified trimester, not applicable or unspecified: Secondary | ICD-10-CM | POA: Diagnosis not present

## 2023-04-23 DIAGNOSIS — O10913 Unspecified pre-existing hypertension complicating pregnancy, third trimester: Secondary | ICD-10-CM

## 2023-04-23 DIAGNOSIS — Z3A33 33 weeks gestation of pregnancy: Secondary | ICD-10-CM | POA: Diagnosis not present

## 2023-04-23 DIAGNOSIS — O99213 Obesity complicating pregnancy, third trimester: Secondary | ICD-10-CM | POA: Diagnosis not present

## 2023-04-23 DIAGNOSIS — O10013 Pre-existing essential hypertension complicating pregnancy, third trimester: Secondary | ICD-10-CM

## 2023-04-23 DIAGNOSIS — O3663X0 Maternal care for excessive fetal growth, third trimester, not applicable or unspecified: Secondary | ICD-10-CM

## 2023-04-23 DIAGNOSIS — O0990 Supervision of high risk pregnancy, unspecified, unspecified trimester: Secondary | ICD-10-CM

## 2023-04-23 NOTE — Procedures (Signed)
Marney Treloar [birth year] [redacted]w[redacted]d  Fetus A Non-Stress Test Interpretation for 04/23/23  Indication: Chronic Hypertenstion and LGA, Morbidly obese  Fetal Heart Rate A Mode: External Baseline Rate (A): 135 bpm Variability: Moderate Accelerations: 15 x 15 Decelerations: None Multiple birth?: No  Uterine Activity Mode: Toco Contraction Frequency (min): none Resting Tone Palpated: Relaxed  Interpretation (Fetal Testing) Nonstress Test Interpretation: Reactive Overall Impression: Reassuring for gestational age Comments: Tracing reviewed by Dr. Darra Lis

## 2023-04-24 NOTE — Progress Notes (Signed)
Alert received on 04/23/2023 through NCNotify system as follows:   Chart review: Blood pressure was rechecked on 04/23/2023  No action needed.  Vidal Schwalbe, [place] 04/24/2023  4:47 PM

## 2023-04-25 ENCOUNTER — Encounter: Payer: Self-pay | Admitting: *Deleted

## 2023-04-29 ENCOUNTER — Ambulatory Visit (INDEPENDENT_AMBULATORY_CARE_PROVIDER_SITE_OTHER): Payer: Commercial Managed Care - HMO | Admitting: Obstetrics and Gynecology

## 2023-04-29 ENCOUNTER — Ambulatory Visit: Payer: Commercial Managed Care - HMO | Admitting: *Deleted

## 2023-04-29 ENCOUNTER — Ambulatory Visit: Payer: Commercial Managed Care - HMO

## 2023-04-29 ENCOUNTER — Other Ambulatory Visit: Payer: Self-pay

## 2023-04-29 ENCOUNTER — Other Ambulatory Visit
Admission: RE | Admit: 2023-04-29 | Discharge: 2023-04-29 | Disposition: A | Discharge status: Home / Self Care | Payer: Commercial Managed Care - HMO | Source: Ambulatory Visit | Attending: Medical | Admitting: Medical

## 2023-04-29 ENCOUNTER — Encounter: Payer: Commercial Managed Care - HMO | Admitting: Medical

## 2023-04-29 ENCOUNTER — Other Ambulatory Visit: Payer: Commercial Managed Care - HMO

## 2023-04-29 ENCOUNTER — Encounter: Payer: Self-pay | Admitting: Obstetrics and Gynecology

## 2023-04-29 ENCOUNTER — Encounter: Payer: Self-pay | Admitting: *Deleted

## 2023-04-29 VITALS — BP 132/73 | HR 91

## 2023-04-29 VITALS — BP 137/85 | HR 101 | Wt 328.0 lb

## 2023-04-29 DIAGNOSIS — O2340 Unspecified infection of urinary tract in pregnancy, unspecified trimester: Secondary | ICD-10-CM | POA: Insufficient documentation

## 2023-04-29 DIAGNOSIS — O23593 Infection of other part of genital tract in pregnancy, third trimester: Secondary | ICD-10-CM

## 2023-04-29 DIAGNOSIS — O99013 Anemia complicating pregnancy, third trimester: Secondary | ICD-10-CM

## 2023-04-29 DIAGNOSIS — O10013 Pre-existing essential hypertension complicating pregnancy, third trimester: Secondary | ICD-10-CM | POA: Diagnosis not present

## 2023-04-29 DIAGNOSIS — Z3A34 34 weeks gestation of pregnancy: Secondary | ICD-10-CM

## 2023-04-29 DIAGNOSIS — A5901 Trichomonal vulvovaginitis: Secondary | ICD-10-CM

## 2023-04-29 DIAGNOSIS — D563 Thalassemia minor: Secondary | ICD-10-CM

## 2023-04-29 DIAGNOSIS — O10913 Unspecified pre-existing hypertension complicating pregnancy, third trimester: Secondary | ICD-10-CM

## 2023-04-29 DIAGNOSIS — O23592 Infection of other part of genital tract in pregnancy, second trimester: Secondary | ICD-10-CM | POA: Diagnosis not present

## 2023-04-29 DIAGNOSIS — O0990 Supervision of high risk pregnancy, unspecified, unspecified trimester: Secondary | ICD-10-CM

## 2023-04-29 DIAGNOSIS — O10919 Unspecified pre-existing hypertension complicating pregnancy, unspecified trimester: Secondary | ICD-10-CM

## 2023-04-29 DIAGNOSIS — O2343 Unspecified infection of urinary tract in pregnancy, third trimester: Secondary | ICD-10-CM

## 2023-04-29 DIAGNOSIS — O3663X0 Maternal care for excessive fetal growth, third trimester, not applicable or unspecified: Secondary | ICD-10-CM | POA: Diagnosis not present

## 2023-04-29 DIAGNOSIS — E669 Obesity, unspecified: Secondary | ICD-10-CM

## 2023-04-29 DIAGNOSIS — B3731 Acute candidiasis of vulva and vagina: Secondary | ICD-10-CM

## 2023-04-29 DIAGNOSIS — B9689 Other specified bacterial agents as the cause of diseases classified elsewhere: Secondary | ICD-10-CM

## 2023-04-29 DIAGNOSIS — O99213 Obesity complicating pregnancy, third trimester: Secondary | ICD-10-CM

## 2023-04-29 DIAGNOSIS — O285 Abnormal chromosomal and genetic finding on antenatal screening of mother: Secondary | ICD-10-CM

## 2023-04-29 DIAGNOSIS — O0993 Supervision of high risk pregnancy, unspecified, third trimester: Secondary | ICD-10-CM

## 2023-04-29 DIAGNOSIS — Z98891 History of uterine scar from previous surgery: Secondary | ICD-10-CM

## 2023-04-29 NOTE — Progress Notes (Signed)
   PRENATAL VISIT NOTE  Subjective:  Anna Welch is a 22 y.o. [phone] at [redacted]w[redacted]d being seen today for ongoing prenatal care.  She is currently monitored for the following issues for this high-risk pregnancy and has Chronic hypertension affecting pregnancy; Anemia in pregnancy; Trichomonal vaginitis during pregnancy in second trimester; Supervision of high risk pregnancy, antepartum; Alpha thalassemia silent carrier; and History of cesarean section on their problem list.  Patient reports no complaints.    Denies leaking of fluid, vaginal bleeding, contractions.   The following portions of the patient's history were reviewed and updated as appropriate: allergies, current medications, past family history, past medical history, past social history, past surgical history and problem list.   Objective:   Vitals:   04/29/23 1447 04/29/23 1451  BP: (!) 133/91 137/85  Pulse: 91 (!) 101  Weight: (!) 328 lb (148.8 kg)     Fetal Status: Fetal Heart Rate (bpm): 152   Movement: Present     General:  Alert, oriented and cooperative. Patient is in no acute distress.  Skin: Skin is warm and dry. No rash noted.   Cardiovascular: Normal heart rate noted  Respiratory: Normal respiratory effort, no problems with respiration noted  Abdomen: Soft, gravid, appropriate for gestational age.  Pain/Pressure: Present     Pelvic: Cervical exam deferred        Extremities: Normal range of motion.     Mental Status: Normal mood and affect. Normal behavior. Normal judgment and thought content.   Assessment and Plan:  Pregnancy: [id number] at [redacted]w[redacted]d  1. Supervision of high risk pregnancy, antepartum BP and FHR normal  Feeling vigorous movement Questions about delivery   2. Chronic hypertension affecting pregnancy Stable on Procardia 30XL Reports some HA, tylenol sometimes helps, discussed excedrine tension and strict precautions on when to go to MAU.  Collecting urine PCR today 4/9 EFW WNL, follow up U/S  today   3. Trichomonal vaginitis during pregnancy in second trimester Was dx first trimester, trouble with pharmacy prescriptions resent 3/26 Completed dose, collecting TOC today  4. Urinary tract infection in mother during pregnancy, antepartum Was dx first trimester, trouble with pharmacy prescriptions resent 3/26 Completed abx, collecting TOC today  5. History of cesarean section C/s x 2, Discussed delivery around 38-39 weeks, will sign consent at 5/14 visit with Dr. Vergie Living  6. Anemia during pregnancy in third trimester 2/27 CBC showed hgb 8.7, collected on 3/26 never resulted. Will repeat today, potentially needs iron infusion   Preterm labor symptoms and general obstetric precautions including but not limited to vaginal bleeding, contractions, leaking of fluid and fetal movement were reviewed in detail with the patient. Please refer to After Visit Summary for other counseling recommendations.     Future Appointments  Date Time Provider Department Center  05/06/2023  7:15 AM [facility] NURSE [facility]  05/06/2023  7:30 AM [facility] US2 [facility]  05/06/2023  8:55 AM [place] Bing, MD [facility]    Albertine Grates, FNP

## 2023-04-30 ENCOUNTER — Telehealth: Payer: Self-pay | Admitting: Pharmacy Technician

## 2023-04-30 ENCOUNTER — Encounter: Payer: Self-pay | Admitting: Obstetrics and Gynecology

## 2023-04-30 ENCOUNTER — Other Ambulatory Visit: Payer: Self-pay | Admitting: Pharmacy Technician

## 2023-04-30 ENCOUNTER — Other Ambulatory Visit: Payer: Self-pay | Admitting: *Deleted

## 2023-04-30 DIAGNOSIS — B9689 Other specified bacterial agents as the cause of diseases classified elsewhere: Secondary | ICD-10-CM | POA: Insufficient documentation

## 2023-04-30 LAB — CERVICOVAGINAL ANCILLARY ONLY
Bacterial Vaginitis (gardnerella): POSITIVE — AB
Candida Glabrata: NEGATIVE
Candida Vaginitis: POSITIVE — AB
Chlamydia: NEGATIVE
Comment: NEGATIVE
Comment: NEGATIVE
Comment: NEGATIVE
Comment: NEGATIVE
Comment: NEGATIVE
Comment: NORMAL
Neisseria Gonorrhea: NEGATIVE
Trichomonas: POSITIVE — AB

## 2023-04-30 LAB — URINE CULTURE

## 2023-04-30 LAB — CBC
Hematocrit: 28.8 % — ABNORMAL LOW (ref 34.0–46.6)
Hemoglobin: 8.7 g/dL — ABNORMAL LOW (ref 11.1–15.9)
MCH: 19.6 pg — ABNORMAL LOW (ref 26.6–33.0)
MCHC: 30.2 g/dL — ABNORMAL LOW (ref 31.5–35.7)
MCV: 65 fL — ABNORMAL LOW (ref 79–97)
Platelets: 145 10*3/uL — ABNORMAL LOW (ref 150–450)
RBC: 4.43 x10E6/uL (ref 3.77–5.28)
RDW: 19.1 % — ABNORMAL HIGH (ref 11.7–15.4)
WBC: 7 10*3/uL (ref 3.4–10.8)

## 2023-04-30 MED ORDER — FLUCONAZOLE 150 MG PO TABS
150.0000 mg | ORAL_TABLET | Freq: Once | ORAL | 0 refills | Status: AC
Start: 2023-04-30 — End: 2023-04-30

## 2023-04-30 MED ORDER — METRONIDAZOLE 500 MG PO TABS
500.0000 mg | ORAL_TABLET | Freq: Two times a day (BID) | ORAL | 0 refills | Status: AC
Start: 2023-04-30 — End: 2023-05-07

## 2023-04-30 NOTE — Telephone Encounter (Signed)
Charlotte Sanes, [place] note:  Patient will be scheduled as soon as possible.  Auth Submission: NO AUTH NEEDED Site of care: Site of care: [facility] Payer: [employer] Medication & CPT/J Code(s) submitted: Venofer (Iron Sucrose) J1756 Route of submission (phone, fax, portal):  Phone # Fax # Auth type: Buy/Bill Units/visits requested: 2 Reference number:  Approval from: 04/30/23 to 08/31/23

## 2023-04-30 NOTE — Addendum Note (Signed)
Addended by: Sue Lush on: 04/30/2023 04:59 PM   Modules accepted: Orders

## 2023-05-01 LAB — PROTEIN / CREATININE RATIO, URINE
Creatinine, Urine: 309.7 mg/dL
Protein, Ur: 55.8 mg/dL
Protein/Creat Ratio: 180 mg/g creat (ref 0–200)

## 2023-05-01 NOTE — Progress Notes (Signed)
Alert received on 04/30/2023 through NCNotify system as follows:  Patient identified as having high BP reading (142/74) on 04/23/2023  Chart review: Blood pressure was rechecked on 04/29/2023  No action needed.  Lowry Bowl, CMA 05/01/2023  4:16 PM

## 2023-05-05 ENCOUNTER — Ambulatory Visit: Payer: Commercial Managed Care - HMO | Attending: Maternal & Fetal Medicine

## 2023-05-06 ENCOUNTER — Ambulatory Visit: Payer: Commercial Managed Care - HMO

## 2023-05-06 ENCOUNTER — Ambulatory Visit (INDEPENDENT_AMBULATORY_CARE_PROVIDER_SITE_OTHER): Payer: Commercial Managed Care - HMO

## 2023-05-06 ENCOUNTER — Encounter: Payer: Self-pay | Admitting: Obstetrics and Gynecology

## 2023-05-06 ENCOUNTER — Encounter: Payer: Self-pay | Admitting: *Deleted

## 2023-05-06 ENCOUNTER — Other Ambulatory Visit: Payer: Self-pay

## 2023-05-06 ENCOUNTER — Ambulatory Visit (INDEPENDENT_AMBULATORY_CARE_PROVIDER_SITE_OTHER): Payer: Commercial Managed Care - HMO | Admitting: Obstetrics and Gynecology

## 2023-05-06 VITALS — BP 122/81 | HR 97 | Wt 330.0 lb

## 2023-05-06 VITALS — BP 127/83 | HR 86 | Temp 98.6°F | Resp 18 | Ht 69.0 in | Wt 332.2 lb

## 2023-05-06 DIAGNOSIS — O99013 Anemia complicating pregnancy, third trimester: Secondary | ICD-10-CM | POA: Diagnosis not present

## 2023-05-06 DIAGNOSIS — O23593 Infection of other part of genital tract in pregnancy, third trimester: Secondary | ICD-10-CM

## 2023-05-06 DIAGNOSIS — O0993 Supervision of high risk pregnancy, unspecified, third trimester: Secondary | ICD-10-CM

## 2023-05-06 DIAGNOSIS — Z3A35 35 weeks gestation of pregnancy: Secondary | ICD-10-CM | POA: Diagnosis not present

## 2023-05-06 DIAGNOSIS — Z8759 Personal history of other complications of pregnancy, childbirth and the puerperium: Secondary | ICD-10-CM

## 2023-05-06 DIAGNOSIS — D696 Thrombocytopenia, unspecified: Secondary | ICD-10-CM | POA: Insufficient documentation

## 2023-05-06 DIAGNOSIS — O3660X0 Maternal care for excessive fetal growth, unspecified trimester, not applicable or unspecified: Secondary | ICD-10-CM | POA: Insufficient documentation

## 2023-05-06 DIAGNOSIS — O10919 Unspecified pre-existing hypertension complicating pregnancy, unspecified trimester: Secondary | ICD-10-CM

## 2023-05-06 DIAGNOSIS — Z6841 Body Mass Index (BMI) 40.0 and over, adult: Secondary | ICD-10-CM | POA: Insufficient documentation

## 2023-05-06 DIAGNOSIS — Z98891 History of uterine scar from previous surgery: Secondary | ICD-10-CM

## 2023-05-06 DIAGNOSIS — A5901 Trichomonal vulvovaginitis: Secondary | ICD-10-CM

## 2023-05-06 DIAGNOSIS — O10913 Unspecified pre-existing hypertension complicating pregnancy, third trimester: Secondary | ICD-10-CM

## 2023-05-06 DIAGNOSIS — D563 Thalassemia minor: Secondary | ICD-10-CM

## 2023-05-06 DIAGNOSIS — O3663X0 Maternal care for excessive fetal growth, third trimester, not applicable or unspecified: Secondary | ICD-10-CM

## 2023-05-06 DIAGNOSIS — O0990 Supervision of high risk pregnancy, unspecified, unspecified trimester: Secondary | ICD-10-CM

## 2023-05-06 DIAGNOSIS — O99119 Other diseases of the blood and blood-forming organs and certain disorders involving the immune mechanism complicating pregnancy, unspecified trimester: Secondary | ICD-10-CM | POA: Insufficient documentation

## 2023-05-06 DIAGNOSIS — O99213 Obesity complicating pregnancy, third trimester: Secondary | ICD-10-CM | POA: Insufficient documentation

## 2023-05-06 DIAGNOSIS — Z8679 Personal history of other diseases of the circulatory system: Secondary | ICD-10-CM | POA: Insufficient documentation

## 2023-05-06 MED ORDER — DIPHENHYDRAMINE HCL 25 MG PO CAPS
25.0000 mg | ORAL_CAPSULE | Freq: Once | ORAL | Status: AC
  Administered 2023-05-06: 25 mg via ORAL
  Filled 2023-05-06: qty 1

## 2023-05-06 MED ORDER — SODIUM CHLORIDE 0.9 % IV SOLN
200.0000 mg | Freq: Once | INTRAVENOUS | Status: AC
  Administered 2023-05-06: 200 mg via INTRAVENOUS
  Filled 2023-05-06: qty 10

## 2023-05-06 MED ORDER — ACETAMINOPHEN 325 MG PO TABS
650.0000 mg | ORAL_TABLET | Freq: Once | ORAL | Status: AC
  Administered 2023-05-06: 650 mg via ORAL
  Filled 2023-05-06: qty 2

## 2023-05-06 NOTE — Progress Notes (Deleted)
  Both at [facility] 08/2019: failed IOL NRFHTs, 41wk, 3cm. 7-8lbs 07/2021: 38wk, scheduled rpt c/s. HTN.

## 2023-05-06 NOTE — Progress Notes (Signed)
PRENATAL VISIT NOTE  Subjective:  Anna Welch is a 22 y.o. [phone] at [redacted]w[redacted]d being seen today for ongoing prenatal care.  She is currently monitored for the following issues for this high-risk pregnancy and has Chronic hypertension affecting pregnancy; Anemia in pregnancy; Trichomonal vaginitis during pregnancy in second trimester; Supervision of high risk pregnancy, antepartum; Alpha thalassemia silent carrier; History of cesarean section; BMI 40.0-44.9, adult (HCC); Obesity in pregnancy, antepartum, third trimester; Gestational thrombocytopenia (HCC); and LGA (large for gestational age) fetus affecting management of mother on their problem list.  Patient reports no complaints.  Contractions: Not present. Vag. Bleeding: None.  Movement: Present. Denies leaking of fluid.   The following portions of the patient's history were reviewed and updated as appropriate: allergies, current medications, past family history, past medical history, past social history, past surgical history and problem list.   Objective:   Vitals:   05/06/23 0912 05/06/23 0914  BP:  122/81  Pulse:  97  Weight: (!) 330 lb (149.7 kg) (!) 330 lb (149.7 kg)    Fetal Status: Fetal Heart Rate (bpm): 140   Movement: Present     General:  Alert, oriented and cooperative. Patient is in no acute distress.  Skin: Skin is warm and dry. No rash noted.   Cardiovascular: Normal heart rate noted  Respiratory: Normal respiratory effort, no problems with respiration noted  Abdomen: Soft, gravid, appropriate for gestational age.  Pain/Pressure: Present     Pelvic: Cervical exam deferred        Extremities: Normal range of motion.  Edema: None  Mental Status: Normal mood and affect. Normal behavior. Normal judgment and thought content.   Assessment and Plan:  Pregnancy: [id number] at [redacted]w[redacted]d 1. Supervision of high risk pregnancy, antepartum Swabs today. Birth control options d/w her and pt considering BTL vs nexplanon. Papers  signed today - Culture, beta strep (group b only) - Anemia Profile B  2. [redacted] weeks gestation of pregnancy  3. Chronic hypertension affecting pregnancy Doing well on procardia 30 qday Pt had appt but was cancelled upstairs. Will schedule her for bpp for this week with [place] and continue with ap testing upstairs at already made appts  4. Trichomonal vaginitis during pregnancy in second trimester Toc mid june  5. Obesity in pregnancy, antepartum, third trimester  6. BMI 40.0-44.9, adult (HCC)  7. Excessive fetal growth affecting management of pregnancy in third trimester, single or unspecified fetus S/p normal GTT and pt states this baby feels bigger than her others 5/7: 97%, 3026g, ac >99%, 8/8, ceph  8. History of cesarean section Both at [facility] 08/2019: failed IOL NRFHTs, 41wk, 3cm. 7-8lbs 07/2021: 38wk, scheduled rpt c/s. HTN.   Long d/w patient re: r/b of tolac vs rpt c-section and she desires repeat. Request sent for 37-38wk delivery, per MFM recommendation  9. Anemia during pregnancy in third trimester First IV iron today. Importance of this d/w her to try and get her levels up as much as possible to decrease risk of transfusion.   10. Alpha thalassemia silent carrier Partner kit declined  11. Gestational thrombocytopenia Repeat labs today  12. H/o PP severe pre-eclampsia Recommend PP lasix course  Preterm labor symptoms and general obstetric precautions including but not limited to vaginal bleeding, contractions, leaking of fluid and fetal movement were reviewed in detail with the patient. Please refer to After Visit Summary for other counseling recommendations.   No follow-ups on file.  Future Appointments  Date Time Provider Department Center  05/06/2023  1:30 PM [facility]CHAIR 5 [facility] None  05/12/2023  2:15 PM [facility] NST [facility]  05/13/2023  1:30 PM [facility]-CHAIR 7 [facility]-IN[facility] None  05/15/2023  2:55 PM Autry-Lott, Randa Evens, DO [facility]  05/20/2023 10:45 AM  [facility] NST [facility]    Narka Bing, MD

## 2023-05-06 NOTE — Progress Notes (Signed)
Diagnosis: Iron Deficiency Anemia  Provider:  Chilton Greathouse MD  Procedure: IV Infusion  IV Type: Peripheral, IV Location: R Antecubital  Venofer (Iron Sucrose), Dose: 200 mg  Infusion Start Time: 1416  Infusion Stop Time: 1434  Post Infusion IV Care: Peripheral IV Discontinued  Discharge: Condition: Good, Destination: Home . AVS Provided  Performed by:  Garnette Czech, RN

## 2023-05-07 LAB — ANEMIA PROFILE B
Basophils Absolute: 0 10*3/uL (ref 0.0–0.2)
Basos: 0 %
EOS (ABSOLUTE): 0.1 10*3/uL (ref 0.0–0.4)
Eos: 2 %
Ferritin: 9 ng/mL — ABNORMAL LOW (ref 15–150)
Folate: 15.4 ng/mL (ref >3.0)
Hematocrit: 28.8 % — ABNORMAL LOW (ref 34.0–46.6)
Hemoglobin: 8.7 g/dL — ABNORMAL LOW (ref 11.1–15.9)
Immature Grans (Abs): 0 10*3/uL (ref 0.0–0.1)
Immature Granulocytes: 0 %
Iron Saturation: 7 % — CL (ref 15–55)
Iron: 40 ug/dL (ref 27–159)
Lymphocytes Absolute: 1.3 10*3/uL (ref 0.7–3.1)
Lymphs: 24 %
MCH: 19.6 pg — ABNORMAL LOW (ref 26.6–33.0)
MCHC: 30.2 g/dL — ABNORMAL LOW (ref 31.5–35.7)
MCV: 65 fL — ABNORMAL LOW (ref 79–97)
Monocytes Absolute: 0.4 10*3/uL (ref 0.1–0.9)
Monocytes: 7 %
Neutrophils Absolute: 3.5 10*3/uL (ref 1.4–7.0)
Neutrophils: 67 %
Platelets: 164 10*3/uL (ref 150–450)
RBC: 4.43 x10E6/uL (ref 3.77–5.28)
RDW: 18.5 % — ABNORMAL HIGH (ref 11.7–15.4)
Retic Ct Pct: 1.5 % (ref 0.6–2.6)
Total Iron Binding Capacity: 537 ug/dL — ABNORMAL HIGH (ref 250–450)
UIBC: 497 ug/dL — ABNORMAL HIGH (ref 131–425)
Vitamin B-12: 364 pg/mL (ref 232–1245)
WBC: 5.4 10*3/uL (ref 3.4–10.8)

## 2023-05-08 ENCOUNTER — Other Ambulatory Visit: Payer: Commercial Managed Care - HMO

## 2023-05-09 ENCOUNTER — Telehealth: Payer: Self-pay

## 2023-05-09 NOTE — Telephone Encounter (Signed)
Called pt to provide delivery date, time, location and Pre-op instructions. Pt confirmed her surgery date of 5/30 at 9:30 am and also understands she must arrive 9:30 am.

## 2023-05-10 LAB — CULTURE, BETA STREP (GROUP B ONLY): Strep Gp B Culture: NEGATIVE

## 2023-05-12 ENCOUNTER — Ambulatory Visit: Payer: Commercial Managed Care - HMO | Attending: Maternal & Fetal Medicine | Admitting: *Deleted

## 2023-05-12 VITALS — BP 151/62 | HR 82

## 2023-05-12 DIAGNOSIS — O99213 Obesity complicating pregnancy, third trimester: Secondary | ICD-10-CM | POA: Diagnosis present

## 2023-05-12 DIAGNOSIS — Z3A36 36 weeks gestation of pregnancy: Secondary | ICD-10-CM | POA: Diagnosis not present

## 2023-05-12 DIAGNOSIS — O10913 Unspecified pre-existing hypertension complicating pregnancy, third trimester: Secondary | ICD-10-CM | POA: Diagnosis present

## 2023-05-12 DIAGNOSIS — O3663X0 Maternal care for excessive fetal growth, third trimester, not applicable or unspecified: Secondary | ICD-10-CM | POA: Insufficient documentation

## 2023-05-12 DIAGNOSIS — O10919 Unspecified pre-existing hypertension complicating pregnancy, unspecified trimester: Secondary | ICD-10-CM

## 2023-05-12 NOTE — Procedures (Signed)
Anna Welch Nov 06, 2001 [redacted]w[redacted]d  Fetus A Non-Stress Test Interpretation for 05/12/23  Indication: Chronic Hypertenstion and Obesity  Fetal Heart Rate A Mode: External Baseline Rate (A): 145 bpm Variability: Moderate Accelerations: 15 x 15 Decelerations: None Multiple birth?: No  Uterine Activity Mode: Palpation, Toco Contraction Frequency (min): None  Interpretation (Fetal Testing) Nonstress Test Interpretation: Reactive Comments: Dr. Parke Poisson reviewed tracing

## 2023-05-13 ENCOUNTER — Other Ambulatory Visit: Payer: Self-pay | Admitting: Family Medicine

## 2023-05-13 ENCOUNTER — Ambulatory Visit (INDEPENDENT_AMBULATORY_CARE_PROVIDER_SITE_OTHER): Payer: Commercial Managed Care - HMO

## 2023-05-13 ENCOUNTER — Ambulatory Visit: Payer: Commercial Managed Care - HMO

## 2023-05-13 VITALS — BP 118/76 | HR 83 | Temp 98.6°F | Resp 20 | Ht 69.0 in | Wt 335.8 lb

## 2023-05-13 DIAGNOSIS — O99013 Anemia complicating pregnancy, third trimester: Secondary | ICD-10-CM | POA: Diagnosis not present

## 2023-05-13 DIAGNOSIS — Z3A36 36 weeks gestation of pregnancy: Secondary | ICD-10-CM | POA: Diagnosis not present

## 2023-05-13 DIAGNOSIS — Z98891 History of uterine scar from previous surgery: Secondary | ICD-10-CM

## 2023-05-13 MED ORDER — SODIUM CHLORIDE 0.9 % IV SOLN
200.0000 mg | Freq: Once | INTRAVENOUS | Status: AC
  Administered 2023-05-13: 200 mg via INTRAVENOUS
  Filled 2023-05-13: qty 10

## 2023-05-13 MED ORDER — DIPHENHYDRAMINE HCL 25 MG PO CAPS
25.0000 mg | ORAL_CAPSULE | Freq: Once | ORAL | Status: AC
  Administered 2023-05-13: 25 mg via ORAL
  Filled 2023-05-13: qty 1

## 2023-05-13 MED ORDER — ACETAMINOPHEN 325 MG PO TABS
650.0000 mg | ORAL_TABLET | Freq: Once | ORAL | Status: AC
  Administered 2023-05-13: 650 mg via ORAL
  Filled 2023-05-13: qty 2

## 2023-05-13 NOTE — Progress Notes (Deleted)
   PRENATAL VISIT NOTE  Subjective:  Anna Welch is a 22 y.o. [phone] at [redacted]w[redacted]d being seen today for ongoing prenatal care.  She is currently monitored for the following issues for this {Blank single:19197::"high-risk","low-risk"} pregnancy and has Chronic hypertension affecting pregnancy; Anemia in pregnancy; Trichomonal vaginitis during pregnancy in second trimester; Supervision of high risk pregnancy, antepartum; Alpha thalassemia silent carrier; History of cesarean section; BMI 40.0-44.9, adult (HCC); Obesity in pregnancy, antepartum, third trimester; LGA (large for gestational age) fetus affecting management of mother; and History of postpartum hypertension on their problem list.  Patient reports {sx:14538}.   .  .   . Denies leaking of fluid.   The following portions of the patient's history were reviewed and updated as appropriate: allergies, current medications, past family history, past medical history, past social history, past surgical history and problem list.   Objective:  There were no vitals filed for this visit.  Fetal Status:           General:  Alert, oriented and cooperative. Patient is in no acute distress.  Skin: Skin is warm and dry. No rash noted.   Cardiovascular: Normal heart rate noted  Respiratory: Normal respiratory effort, no problems with respiration noted  Abdomen: Soft, gravid, appropriate for gestational age.        Pelvic: {Blank single:19197::"Cervical exam performed in the presence of a chaperone","Cervical exam deferred"}        Extremities: Normal range of motion.     Mental Status: Normal mood and affect. Normal behavior. Normal judgment and thought content.   Assessment and Plan:  Pregnancy: [id number] at [redacted]w[redacted]d 1. Supervision of high risk pregnancy, antepartum ***  2. Chronic hypertension affecting pregnancy ***  3. Trichomonal vaginitis during pregnancy in second trimester ***  4. Anemia during pregnancy in third trimester ***  5. History  of cesarean section ***  6. Excessive fetal growth affecting management of pregnancy, antepartum, single or unspecified fetus ***  7. [redacted] weeks gestation of pregnancy ***  {Blank single:19197::"Term","Preterm"} labor symptoms and general obstetric precautions including but not limited to vaginal bleeding, contractions, leaking of fluid and fetal movement were reviewed in detail with the patient. Please refer to After Visit Summary for other counseling recommendations.   No follow-ups on file.  Future Appointments  Date Time Provider Department Center  05/15/2023  2:55 PM Donna Bernard [facility]  05/20/2023 10:45 AM [facility] NST [facility]    Maeci Kalbfleisch Autry-Lott, DO

## 2023-05-13 NOTE — Progress Notes (Signed)
Diagnosis: Acute Anemia  Provider:  Chilton Greathouse MD  Procedure: IV Infusion  IV Type: Peripheral, IV Location: R Antecubital  Venofer (Iron Sucrose), Dose: 200 mg  Infusion Start Time: 1431  Infusion Stop Time: 1453  Post Infusion IV Care: Peripheral IV Discontinued  Discharge: Condition: Good, Destination: Home . AVS Declined  Performed by:  Marilynn Rail, RN

## 2023-05-15 ENCOUNTER — Other Ambulatory Visit: Payer: Self-pay

## 2023-05-15 ENCOUNTER — Ambulatory Visit (INDEPENDENT_AMBULATORY_CARE_PROVIDER_SITE_OTHER): Payer: Commercial Managed Care - HMO | Admitting: Family Medicine

## 2023-05-15 ENCOUNTER — Telehealth (HOSPITAL_COMMUNITY): Payer: Self-pay | Admitting: *Deleted

## 2023-05-15 ENCOUNTER — Encounter (HOSPITAL_COMMUNITY): Payer: Self-pay

## 2023-05-15 ENCOUNTER — Encounter: Payer: Commercial Managed Care - HMO | Admitting: Family Medicine

## 2023-05-15 VITALS — BP 135/79 | HR 98 | Wt 334.0 lb

## 2023-05-15 DIAGNOSIS — O0990 Supervision of high risk pregnancy, unspecified, unspecified trimester: Secondary | ICD-10-CM

## 2023-05-15 DIAGNOSIS — O99013 Anemia complicating pregnancy, third trimester: Secondary | ICD-10-CM

## 2023-05-15 DIAGNOSIS — O3660X0 Maternal care for excessive fetal growth, unspecified trimester, not applicable or unspecified: Secondary | ICD-10-CM

## 2023-05-15 DIAGNOSIS — O10913 Unspecified pre-existing hypertension complicating pregnancy, third trimester: Secondary | ICD-10-CM

## 2023-05-15 DIAGNOSIS — O3663X0 Maternal care for excessive fetal growth, third trimester, not applicable or unspecified: Secondary | ICD-10-CM

## 2023-05-15 DIAGNOSIS — O10919 Unspecified pre-existing hypertension complicating pregnancy, unspecified trimester: Secondary | ICD-10-CM

## 2023-05-15 DIAGNOSIS — Z8619 Personal history of other infectious and parasitic diseases: Secondary | ICD-10-CM

## 2023-05-15 DIAGNOSIS — O0993 Supervision of high risk pregnancy, unspecified, third trimester: Secondary | ICD-10-CM

## 2023-05-15 DIAGNOSIS — A5901 Trichomonal vulvovaginitis: Secondary | ICD-10-CM

## 2023-05-15 DIAGNOSIS — Z98891 History of uterine scar from previous surgery: Secondary | ICD-10-CM

## 2023-05-15 DIAGNOSIS — Z3A36 36 weeks gestation of pregnancy: Secondary | ICD-10-CM

## 2023-05-15 NOTE — Progress Notes (Signed)
   PRENATAL VISIT NOTE  Subjective:  Anna Welch is a 22 y.o. [phone] at [redacted]w[redacted]d being seen today for ongoing prenatal care.  She is currently monitored for the following issues for this high-risk pregnancy and has Chronic hypertension affecting pregnancy; Anemia in pregnancy; Trichomonal vaginitis during pregnancy in second trimester; Supervision of high risk pregnancy, antepartum; Alpha thalassemia silent carrier; History of cesarean section; BMI 40.0-44.9, adult (HCC); Obesity in pregnancy, antepartum, third trimester; LGA (large for gestational age) fetus affecting management of mother; and History of postpartum hypertension on their problem list.  Patient reports no complaints.  Contractions: Not present. Vag. Bleeding: None.  Movement: Present. Denies leaking of fluid.   The following portions of the patient's history were reviewed and updated as appropriate: allergies, current medications, past family history, past medical history, past social history, past surgical history and problem list.   Objective:   Vitals:   05/15/23 1544  BP: 135/79  Pulse: 98  Weight: (!) 334 lb (151.5 kg)    Fetal Status: Fetal Heart Rate (bpm): 150 Fundal Height: 41 cm Movement: Present     General:  Alert, oriented and cooperative. Patient is in no acute distress.  Skin: Skin is warm and dry. No rash noted.   Cardiovascular: Normal heart rate noted  Respiratory: Normal respiratory effort, no problems with respiration noted  Abdomen: Soft, gravid, appropriate for gestational age.  Pain/Pressure: Absent     Pelvic: Cervical exam performed in the presence of a chaperone       GC swab collected.  Extremities: Normal range of motion.  Edema: None  Mental Status: Normal mood and affect. Normal behavior. Normal judgment and thought content.   Assessment and Plan:  Pregnancy: [id number] at [redacted]w[redacted]d  1. Supervision of high risk pregnancy, antepartum No acute concerns.   2. Chronic hypertension affecting  pregnancy Wnl. Continue procardia. NST 5/28  3. History of cesarean section Repeat scheduled for 5/30  4. Excessive fetal growth affecting management of pregnancy, antepartum, single or unspecified fetus EFW 3096 97% @34 /2. Fundal height today is 41  5. History of trichomoniasis Has 1 more dose of treatment to take. Denies symptoms. No TOC today. TOC in postpartum.   Preterm labor symptoms and general obstetric precautions including but not limited to vaginal bleeding, contractions, leaking of fluid and fetal movement were reviewed in detail with the patient. Please refer to After Visit Summary for other counseling recommendations.   No follow-ups on file.  Future Appointments  Date Time Provider Department Center  05/20/2023  9:45 AM [facility] PAT 1 [facility] None  05/20/2023 10:45 AM [facility] NST [facility]    Verdia Bolt Autry-Lott, DO

## 2023-05-15 NOTE — Telephone Encounter (Signed)
Preadmission screen  

## 2023-05-16 ENCOUNTER — Encounter (HOSPITAL_COMMUNITY): Payer: Self-pay

## 2023-05-16 NOTE — Patient Instructions (Signed)
Azaleia Arrellano  05/16/2023   Your procedure is scheduled on:  05/22/2023  Arrive at 0730 at Entrance C on [employer] at [facility]  and [employer]. You are invited to use the FREE valet parking or use the Visitor's parking deck.  Pick up the phone at the desk and dial [phone].  Call this number if you have problems the morning of surgery: [phone]  Remember:   Do not eat food:(After Midnight) Desps de medianoche.  Do not drink clear liquids: (After Midnight) Desps de medianoche.  Take these medicines the morning of surgery with A SIP OF WATER:  none   Do not wear jewelry, make-up or nail polish.  Do not wear lotions, powders, or perfumes. Do not wear deodorant.  Do not shave 48 hours prior to surgery.  Do not bring valuables to the hospital.  [facility] is not   responsible for any belongings or valuables brought to the hospital.  Contacts, dentures or bridgework may not be worn into surgery.  Leave suitcase in the car. After surgery it may be brought to your room.  For patients admitted to the hospital, checkout time is 11:00 AM the day of              discharge.      Please read over the following fact sheets that you were given:     Preparing for Surgery

## 2023-05-20 ENCOUNTER — Encounter (HOSPITAL_COMMUNITY): Admission: RE | Admit: 2023-05-20 | Payer: Commercial Managed Care - HMO | Source: Ambulatory Visit

## 2023-05-20 ENCOUNTER — Ambulatory Visit (HOSPITAL_BASED_OUTPATIENT_CLINIC_OR_DEPARTMENT_OTHER): Payer: Commercial Managed Care - HMO | Admitting: *Deleted

## 2023-05-20 ENCOUNTER — Encounter (HOSPITAL_COMMUNITY)
Admission: RE | Admit: 2023-05-20 | Discharge: 2023-05-20 | Disposition: A | Discharge status: Home / Self Care | Payer: Commercial Managed Care - HMO | Source: Ambulatory Visit | Attending: Family Medicine | Admitting: Family Medicine

## 2023-05-20 VITALS — BP 145/78 | HR 81

## 2023-05-20 DIAGNOSIS — Z3A37 37 weeks gestation of pregnancy: Secondary | ICD-10-CM

## 2023-05-20 DIAGNOSIS — E669 Obesity, unspecified: Secondary | ICD-10-CM | POA: Diagnosis not present

## 2023-05-20 DIAGNOSIS — O10919 Unspecified pre-existing hypertension complicating pregnancy, unspecified trimester: Secondary | ICD-10-CM

## 2023-05-20 DIAGNOSIS — O10913 Unspecified pre-existing hypertension complicating pregnancy, third trimester: Secondary | ICD-10-CM | POA: Insufficient documentation

## 2023-05-20 DIAGNOSIS — Z01812 Encounter for preprocedural laboratory examination: Secondary | ICD-10-CM | POA: Insufficient documentation

## 2023-05-20 DIAGNOSIS — O99213 Obesity complicating pregnancy, third trimester: Secondary | ICD-10-CM

## 2023-05-20 DIAGNOSIS — O10013 Pre-existing essential hypertension complicating pregnancy, third trimester: Secondary | ICD-10-CM

## 2023-05-20 DIAGNOSIS — Z98891 History of uterine scar from previous surgery: Secondary | ICD-10-CM | POA: Insufficient documentation

## 2023-05-20 HISTORY — DX: Unspecified pre-eclampsia, complicating the puerperium: O14.95

## 2023-05-20 LAB — COMPREHENSIVE METABOLIC PANEL
ALT: 11 U/L (ref 0–44)
AST: 13 U/L — ABNORMAL LOW (ref 15–41)
Albumin: 2.8 g/dL — ABNORMAL LOW (ref 3.5–5.0)
Alkaline Phosphatase: 93 U/L (ref 38–126)
Anion gap: 10 (ref 5–15)
BUN: <5 mg/dL — ABNORMAL LOW (ref 6–20)
CO2: 20 mmol/L — ABNORMAL LOW (ref 22–32)
Calcium: 9 mg/dL (ref 8.9–10.3)
Chloride: 106 mmol/L (ref 98–111)
Creatinine, Ser: 0.55 mg/dL (ref 0.44–1.00)
GFR, Estimated: >60 mL/min (ref >60)
Glucose, Bld: 98 mg/dL (ref 70–99)
Potassium: 3.4 mmol/L — ABNORMAL LOW (ref 3.5–5.1)
Sodium: 136 mmol/L (ref 135–145)
Total Bilirubin: 0.4 mg/dL (ref 0.3–1.2)
Total Protein: 6.4 g/dL — ABNORMAL LOW (ref 6.5–8.1)

## 2023-05-20 LAB — CBC
HCT: 29 % — ABNORMAL LOW (ref 36.0–46.0)
Hemoglobin: 8.4 g/dL — ABNORMAL LOW (ref 12.0–15.0)
MCH: 19.4 pg — ABNORMAL LOW (ref 26.0–34.0)
MCHC: 29 g/dL — ABNORMAL LOW (ref 30.0–36.0)
MCV: 67 fL — ABNORMAL LOW (ref 80.0–100.0)
Platelets: 146 10*3/uL — ABNORMAL LOW (ref 150–400)
RBC: 4.33 MIL/uL (ref 3.87–5.11)
RDW: 20.7 % — ABNORMAL HIGH (ref 11.5–15.5)
WBC: 5.6 10*3/uL (ref 4.0–10.5)
nRBC: 0 % (ref 0.0–0.2)

## 2023-05-20 LAB — RAPID HIV SCREEN (HIV 1/2 AB+AG)
HIV 1/2 Antibodies: NONREACTIVE
HIV-1 P24 Antigen - HIV24: NONREACTIVE

## 2023-05-20 LAB — TYPE AND SCREEN
ABO/RH(D): A POS
Antibody Screen: NEGATIVE

## 2023-05-20 LAB — RPR: RPR Ser Ql: NONREACTIVE

## 2023-05-20 NOTE — Procedures (Signed)
Anna Welch May 14, 2001 [redacted]w[redacted]d  Fetus A Non-Stress Test Interpretation for 05/20/23  Indication: Chronic Hypertenstion and Obesity  Fetal Heart Rate A Mode: External Baseline Rate (A): 150 bpm Variability: Moderate Accelerations: 15 x 15 Decelerations: None Multiple birth?: No  Uterine Activity Mode: Palpation, Toco Contraction Frequency (min): None  Interpretation (Fetal Testing) Nonstress Test Interpretation: Reactive Comments: Dr. Grace Bushy reviewed tracing.

## 2023-05-21 ENCOUNTER — Encounter (HOSPITAL_COMMUNITY): Payer: Self-pay | Admitting: Family Medicine

## 2023-05-21 NOTE — Anesthesia Preprocedure Evaluation (Signed)
Anesthesia Evaluation  Patient identified by MRN, date of birth, ID band Patient awake    Reviewed: Allergy & Precautions, NPO status , Patient's Chart, lab work & pertinent test results  Airway Mallampati: III  TM Distance: >3 FB Neck ROM: Full    Dental  (+) Poor Dentition, Chipped, Dental Advisory Given   Pulmonary neg pulmonary ROS   Pulmonary exam normal breath sounds clear to auscultation       Cardiovascular hypertension, Pt. on medications Normal cardiovascular exam Rhythm:Regular Rate:Normal     Neuro/Psych negative neurological ROS  negative psych ROS   GI/Hepatic Neg liver ROS,GERD  Medicated,,  Endo/Other    Morbid obesity  Renal/GU negative Renal ROS  negative genitourinary   Musculoskeletal negative musculoskeletal ROS (+)    Abdominal  (+) + obese  Peds  Hematology  (+) Blood dyscrasia, anemia Alpha thalassemia carrier Thrombocytopenia- plt 146k   Anesthesia Other Findings   Reproductive/Obstetrics (+) Pregnancy Hx/o previous C/Section Fetal macrosomia LGA                              Anesthesia Physical Anesthesia Plan  ASA: 3  Anesthesia Plan: Spinal   Post-op Pain Management: Minimal or no pain anticipated and Regional block*   Induction: Intravenous  PONV Risk Score and Plan: 4 or greater and Treatment may vary due to age or medical condition and Scopolamine patch - Pre-op  Airway Management Planned: Natural Airway  Additional Equipment: None  Intra-op Plan:   Post-operative Plan:   Informed Consent: I have reviewed the patients History and Physical, chart, labs and discussed the procedure including the risks, benefits and alternatives for the proposed anesthesia with the patient or authorized representative who has indicated his/her understanding and acceptance.     Dental advisory given  Plan Discussed with: Anesthesiologist and CRNA  Anesthesia  Plan Comments:          Anesthesia Quick Evaluation

## 2023-05-22 ENCOUNTER — Other Ambulatory Visit: Payer: Self-pay

## 2023-05-22 ENCOUNTER — Inpatient Hospital Stay (HOSPITAL_COMMUNITY): Payer: Commercial Managed Care - HMO | Admitting: Anesthesiology

## 2023-05-22 ENCOUNTER — Encounter: Payer: Commercial Managed Care - HMO | Admitting: Obstetrics and Gynecology

## 2023-05-22 ENCOUNTER — Encounter: Payer: Self-pay | Admitting: Obstetrics and Gynecology

## 2023-05-22 ENCOUNTER — Encounter (HOSPITAL_COMMUNITY): Payer: Self-pay | Admitting: Family Medicine

## 2023-05-22 ENCOUNTER — Encounter (HOSPITAL_COMMUNITY)
Admission: AD | Disposition: A | Discharge status: Home / Self Care | Payer: Self-pay | Source: Home / Self Care | Attending: Family Medicine

## 2023-05-22 ENCOUNTER — Inpatient Hospital Stay (HOSPITAL_COMMUNITY)
Admission: AD | Admit: 2023-05-22 | Discharge: 2023-05-24 | DRG: 787 | Disposition: A | Discharge status: Home / Self Care | Payer: Commercial Managed Care - HMO | Attending: Family Medicine | Admitting: Family Medicine

## 2023-05-22 DIAGNOSIS — Z148 Genetic carrier of other disease: Secondary | ICD-10-CM | POA: Diagnosis not present

## 2023-05-22 DIAGNOSIS — O99214 Obesity complicating childbirth: Secondary | ICD-10-CM

## 2023-05-22 DIAGNOSIS — O9902 Anemia complicating childbirth: Secondary | ICD-10-CM | POA: Diagnosis present

## 2023-05-22 DIAGNOSIS — D649 Anemia, unspecified: Secondary | ICD-10-CM | POA: Diagnosis not present

## 2023-05-22 DIAGNOSIS — O1092 Unspecified pre-existing hypertension complicating childbirth: Secondary | ICD-10-CM | POA: Diagnosis present

## 2023-05-22 DIAGNOSIS — O3663X0 Maternal care for excessive fetal growth, third trimester, not applicable or unspecified: Secondary | ICD-10-CM | POA: Diagnosis present

## 2023-05-22 DIAGNOSIS — Z3A37 37 weeks gestation of pregnancy: Secondary | ICD-10-CM

## 2023-05-22 DIAGNOSIS — Z5982 Transportation insecurity: Secondary | ICD-10-CM

## 2023-05-22 DIAGNOSIS — O99213 Obesity complicating pregnancy, third trimester: Secondary | ICD-10-CM | POA: Diagnosis present

## 2023-05-22 DIAGNOSIS — O10919 Unspecified pre-existing hypertension complicating pregnancy, unspecified trimester: Secondary | ICD-10-CM | POA: Diagnosis present

## 2023-05-22 DIAGNOSIS — Z30017 Encounter for initial prescription of implantable subdermal contraceptive: Secondary | ICD-10-CM | POA: Diagnosis not present

## 2023-05-22 DIAGNOSIS — O34211 Maternal care for low transverse scar from previous cesarean delivery: Secondary | ICD-10-CM

## 2023-05-22 DIAGNOSIS — O0990 Supervision of high risk pregnancy, unspecified, unspecified trimester: Secondary | ICD-10-CM

## 2023-05-22 DIAGNOSIS — O99019 Anemia complicating pregnancy, unspecified trimester: Secondary | ICD-10-CM | POA: Diagnosis present

## 2023-05-22 DIAGNOSIS — O1002 Pre-existing essential hypertension complicating childbirth: Secondary | ICD-10-CM | POA: Diagnosis not present

## 2023-05-22 DIAGNOSIS — Z98891 History of uterine scar from previous surgery: Secondary | ICD-10-CM

## 2023-05-22 LAB — TYPE AND SCREEN

## 2023-05-22 LAB — BPAM RBC: Blood Product Expiration Date: 202406202359

## 2023-05-22 LAB — ABO/RH: ABO/RH(D): A POS

## 2023-05-22 LAB — PREPARE RBC (CROSSMATCH)

## 2023-05-22 SURGERY — Surgical Case
Anesthesia: Spinal

## 2023-05-22 MED ORDER — NALOXONE HCL 4 MG/10ML IJ SOLN: 1.0000 ug/kg/h | INTRAVENOUS | Status: DC | PRN

## 2023-05-22 MED ORDER — SODIUM CHLORIDE 0.9% FLUSH: 3.0000 mL | INTRAVENOUS | Status: DC | PRN

## 2023-05-22 MED ORDER — KETAMINE HCL 10 MG/ML IJ SOLN
INTRAMUSCULAR | Status: DC | PRN
  Administered 2023-05-22: 10 mg via INTRAVENOUS
  Administered 2023-05-22: 40 mg via INTRAVENOUS

## 2023-05-22 MED ORDER — ONDANSETRON HCL 4 MG/2ML IJ SOLN: 4.0000 mg | Freq: Three times a day (TID) | INTRAMUSCULAR | Status: DC | PRN

## 2023-05-22 MED ORDER — OXYTOCIN-SODIUM CHLORIDE 30-0.9 UT/500ML-% IV SOLN
INTRAVENOUS | Status: DC | PRN
  Administered 2023-05-22: 450 mL via INTRAVENOUS

## 2023-05-22 MED ORDER — DEXAMETHASONE SODIUM PHOSPHATE 10 MG/ML IJ SOLN
INTRAMUSCULAR | Status: DC | PRN
  Administered 2023-05-22: 8 mg via INTRAVENOUS

## 2023-05-22 MED ORDER — CHLOROPROCAINE HCL (PF) 3 % IJ SOLN
INTRAMUSCULAR | Status: AC
  Filled 2023-05-22: qty 20

## 2023-05-22 MED ORDER — BUPIVACAINE IN DEXTROSE 0.75-8.25 % IT SOLN
INTRATHECAL | Status: DC | PRN
  Administered 2023-05-22: 2 mL via INTRATHECAL

## 2023-05-22 MED ORDER — PRENATAL MULTIVITAMIN CH
1.0000 | ORAL_TABLET | Freq: Every day | ORAL | Status: DC
  Administered 2023-05-22 – 2023-05-23 (×2): 1 via ORAL
  Filled 2023-05-22 (×2): qty 1

## 2023-05-22 MED ORDER — ENOXAPARIN SODIUM 40 MG/0.4ML IJ SOSY
40.0000 mg | PREFILLED_SYRINGE | INTRAMUSCULAR | Status: DC
  Administered 2023-05-23 – 2023-05-24 (×2): 40 mg via SUBCUTANEOUS
  Filled 2023-05-22 (×2): qty 0.4

## 2023-05-22 MED ORDER — TETANUS-DIPHTH-ACELL PERTUSSIS 5-2.5-18.5 LF-MCG/0.5 IM SUSY: 0.5000 mL | PREFILLED_SYRINGE | Freq: Once | INTRAMUSCULAR | Status: DC

## 2023-05-22 MED ORDER — SCOPOLAMINE 1 MG/3DAYS TD PT72
MEDICATED_PATCH | TRANSDERMAL | Status: AC
  Filled 2023-05-22: qty 1

## 2023-05-22 MED ORDER — ONDANSETRON HCL 4 MG/2ML IJ SOLN
INTRAMUSCULAR | Status: DC | PRN
  Administered 2023-05-22: 4 mg via INTRAVENOUS

## 2023-05-22 MED ORDER — LIDOCAINE-EPINEPHRINE (PF) 2 %-1:200000 IJ SOLN
INTRAMUSCULAR | Status: DC | PRN
  Administered 2023-05-22: 1 mL

## 2023-05-22 MED ORDER — SIMETHICONE 80 MG PO CHEW
80.0000 mg | CHEWABLE_TABLET | Freq: Three times a day (TID) | ORAL | Status: DC
  Administered 2023-05-22 – 2023-05-24 (×5): 80 mg via ORAL
  Filled 2023-05-22 (×5): qty 1

## 2023-05-22 MED ORDER — ONDANSETRON HCL 4 MG/2ML IJ SOLN
INTRAMUSCULAR | Status: AC
  Filled 2023-05-22: qty 2

## 2023-05-22 MED ORDER — SCOPOLAMINE 1 MG/3DAYS TD PT72
1.0000 | MEDICATED_PATCH | TRANSDERMAL | Status: DC
  Administered 2023-05-22: 1.5 mg via TRANSDERMAL

## 2023-05-22 MED ORDER — FENTANYL CITRATE (PF) 100 MCG/2ML IJ SOLN
INTRAMUSCULAR | Status: AC
  Filled 2023-05-22: qty 2

## 2023-05-22 MED ORDER — SENNOSIDES-DOCUSATE SODIUM 8.6-50 MG PO TABS
2.0000 | ORAL_TABLET | Freq: Every day | ORAL | Status: DC
  Administered 2023-05-23 – 2023-05-24 (×2): 2 via ORAL
  Filled 2023-05-22 (×2): qty 2

## 2023-05-22 MED ORDER — KETOROLAC TROMETHAMINE 30 MG/ML IJ SOLN
30.0000 mg | Freq: Four times a day (QID) | INTRAMUSCULAR | Status: AC | PRN
  Administered 2023-05-22: 30 mg via INTRAMUSCULAR

## 2023-05-22 MED ORDER — CEFAZOLIN IN SODIUM CHLORIDE 3-0.9 GM/100ML-% IV SOLN
INTRAVENOUS | Status: AC
  Filled 2023-05-22: qty 100

## 2023-05-22 MED ORDER — DIPHENHYDRAMINE HCL 25 MG PO CAPS: 25.0000 mg | ORAL_CAPSULE | Freq: Four times a day (QID) | ORAL | Status: DC | PRN

## 2023-05-22 MED ORDER — LACTATED RINGERS IV SOLN: INTRAVENOUS | Status: DC

## 2023-05-22 MED ORDER — METOCLOPRAMIDE HCL 5 MG/ML IJ SOLN
INTRAMUSCULAR | Status: AC
  Filled 2023-05-22: qty 2

## 2023-05-22 MED ORDER — ACETAMINOPHEN 500 MG PO TABS
1000.0000 mg | ORAL_TABLET | Freq: Four times a day (QID) | ORAL | Status: DC
  Administered 2023-05-23 – 2023-05-24 (×5): 1000 mg via ORAL
  Filled 2023-05-22 (×7): qty 2

## 2023-05-22 MED ORDER — OXYTOCIN-SODIUM CHLORIDE 30-0.9 UT/500ML-% IV SOLN
INTRAVENOUS | Status: AC
  Filled 2023-05-22: qty 500

## 2023-05-22 MED ORDER — PHENYLEPHRINE HCL-NACL 20-0.9 MG/250ML-% IV SOLN
INTRAVENOUS | Status: AC
  Filled 2023-05-22: qty 250

## 2023-05-22 MED ORDER — OXYCODONE HCL 5 MG PO TABS
5.0000 mg | ORAL_TABLET | ORAL | Status: DC | PRN
  Filled 2023-05-22: qty 2

## 2023-05-22 MED ORDER — MAGNESIUM HYDROXIDE 400 MG/5ML PO SUSP: 30.0000 mL | ORAL | Status: DC | PRN

## 2023-05-22 MED ORDER — CEFAZOLIN IN SODIUM CHLORIDE 3-0.9 GM/100ML-% IV SOLN
3.0000 g | INTRAVENOUS | Status: AC
  Administered 2023-05-22: 3 g via INTRAVENOUS

## 2023-05-22 MED ORDER — CHLOROPROCAINE HCL (PF) 3 % IJ SOLN
INTRAMUSCULAR | Status: DC | PRN
  Administered 2023-05-22: 20 mL

## 2023-05-22 MED ORDER — KETOROLAC TROMETHAMINE 30 MG/ML IJ SOLN
INTRAMUSCULAR | Status: AC
  Filled 2023-05-22: qty 1

## 2023-05-22 MED ORDER — SOD CITRATE-CITRIC ACID 500-334 MG/5ML PO SOLN
ORAL | Status: AC
  Filled 2023-05-22: qty 30

## 2023-05-22 MED ORDER — DEXAMETHASONE SODIUM PHOSPHATE 4 MG/ML IJ SOLN
INTRAMUSCULAR | Status: AC
  Filled 2023-05-22: qty 2

## 2023-05-22 MED ORDER — ACETAMINOPHEN 10 MG/ML IV SOLN
INTRAVENOUS | Status: DC | PRN
  Administered 2023-05-22: 1000 mg via INTRAVENOUS

## 2023-05-22 MED ORDER — MIDAZOLAM HCL 2 MG/2ML IJ SOLN
INTRAMUSCULAR | Status: AC
  Filled 2023-05-22: qty 2

## 2023-05-22 MED ORDER — LIDOCAINE-EPINEPHRINE (PF) 2 %-1:200000 IJ SOLN
INTRAMUSCULAR | Status: DC | PRN
  Administered 2023-05-22 (×2): 1 mL via INTRADERMAL

## 2023-05-22 MED ORDER — PHENYLEPHRINE HCL-NACL 20-0.9 MG/250ML-% IV SOLN
INTRAVENOUS | Status: DC | PRN
  Administered 2023-05-22: 60 ug/min via INTRAVENOUS

## 2023-05-22 MED ORDER — SIMETHICONE 80 MG PO CHEW
80.0000 mg | CHEWABLE_TABLET | ORAL | Status: DC | PRN
  Administered 2023-05-23: 80 mg via ORAL

## 2023-05-22 MED ORDER — PHENYLEPHRINE 80 MCG/ML (10ML) SYRINGE FOR IV PUSH (FOR BLOOD PRESSURE SUPPORT)
PREFILLED_SYRINGE | INTRAVENOUS | Status: AC
  Filled 2023-05-22: qty 10

## 2023-05-22 MED ORDER — SODIUM CHLORIDE 0.9% IV SOLUTION: Freq: Once | INTRAVENOUS | Status: DC

## 2023-05-22 MED ORDER — ZOLPIDEM TARTRATE 5 MG PO TABS: 5.0000 mg | ORAL_TABLET | Freq: Every evening | ORAL | Status: DC | PRN

## 2023-05-22 MED ORDER — MIDAZOLAM HCL 2 MG/2ML IJ SOLN
INTRAMUSCULAR | Status: DC | PRN
  Administered 2023-05-22: 1 mg via INTRAVENOUS

## 2023-05-22 MED ORDER — BUPIVACAINE IN DEXTROSE 0.75-8.25 % IT SOLN
INTRATHECAL | Status: AC
  Filled 2023-05-22: qty 2

## 2023-05-22 MED ORDER — DIBUCAINE (PERIANAL) 1 % EX OINT: 1.0000 | TOPICAL_OINTMENT | CUTANEOUS | Status: DC | PRN

## 2023-05-22 MED ORDER — OXYTOCIN-SODIUM CHLORIDE 30-0.9 UT/500ML-% IV SOLN
2.5000 [IU]/h | INTRAVENOUS | Status: AC
  Administered 2023-05-22: 2.5 [IU]/h via INTRAVENOUS

## 2023-05-22 MED ORDER — LIDOCAINE-EPINEPHRINE (PF) 2 %-1:200000 IJ SOLN
INTRAMUSCULAR | Status: AC
  Filled 2023-05-22: qty 20

## 2023-05-22 MED ORDER — DIPHENHYDRAMINE HCL 25 MG PO CAPS: 25.0000 mg | ORAL_CAPSULE | ORAL | Status: DC | PRN

## 2023-05-22 MED ORDER — COCONUT OIL OIL: 1.0000 | TOPICAL_OIL | Status: DC | PRN

## 2023-05-22 MED ORDER — TRANEXAMIC ACID-NACL 1000-0.7 MG/100ML-% IV SOLN
INTRAVENOUS | Status: DC | PRN
  Administered 2023-05-22: 1000 mg via INTRAVENOUS

## 2023-05-22 MED ORDER — DEXMEDETOMIDINE HCL IN NACL 80 MCG/20ML IV SOLN
INTRAVENOUS | Status: AC
  Filled 2023-05-22: qty 20

## 2023-05-22 MED ORDER — SOD CITRATE-CITRIC ACID 500-334 MG/5ML PO SOLN
30.0000 mL | ORAL | Status: AC
  Administered 2023-05-22: 30 mL via ORAL

## 2023-05-22 MED ORDER — DEXMEDETOMIDINE HCL IN NACL 80 MCG/20ML IV SOLN
INTRAVENOUS | Status: DC | PRN
  Administered 2023-05-22: 8 ug via INTRAVENOUS

## 2023-05-22 MED ORDER — KETOROLAC TROMETHAMINE 30 MG/ML IJ SOLN: 30.0000 mg | Freq: Four times a day (QID) | INTRAMUSCULAR | Status: AC | PRN

## 2023-05-22 MED ORDER — IBUPROFEN 600 MG PO TABS
600.0000 mg | ORAL_TABLET | Freq: Four times a day (QID) | ORAL | Status: DC
  Administered 2023-05-23 – 2023-05-24 (×4): 600 mg via ORAL
  Filled 2023-05-22 (×4): qty 1

## 2023-05-22 MED ORDER — KETOROLAC TROMETHAMINE 30 MG/ML IJ SOLN
30.0000 mg | Freq: Four times a day (QID) | INTRAMUSCULAR | Status: AC
  Administered 2023-05-22 – 2023-05-23 (×3): 30 mg via INTRAVENOUS
  Filled 2023-05-22 (×4): qty 1

## 2023-05-22 MED ORDER — TRANEXAMIC ACID-NACL 1000-0.7 MG/100ML-% IV SOLN
INTRAVENOUS | Status: AC
  Filled 2023-05-22: qty 100

## 2023-05-22 MED ORDER — KETAMINE HCL 50 MG/5ML IJ SOSY
PREFILLED_SYRINGE | INTRAMUSCULAR | Status: AC
  Filled 2023-05-22: qty 5

## 2023-05-22 MED ORDER — MENTHOL 3 MG MT LOZG: 1.0000 | LOZENGE | OROMUCOSAL | Status: DC | PRN

## 2023-05-22 MED ORDER — DIPHENHYDRAMINE HCL 50 MG/ML IJ SOLN: 12.5000 mg | INTRAMUSCULAR | Status: DC | PRN

## 2023-05-22 MED ORDER — NALOXONE HCL 0.4 MG/ML IJ SOLN: 0.4000 mg | INTRAMUSCULAR | Status: DC | PRN

## 2023-05-22 MED ORDER — NIFEDIPINE ER OSMOTIC RELEASE 30 MG PO TB24
30.0000 mg | ORAL_TABLET | Freq: Every day | ORAL | Status: DC
  Administered 2023-05-22 – 2023-05-24 (×3): 30 mg via ORAL
  Filled 2023-05-22 (×3): qty 1

## 2023-05-22 MED ORDER — STERILE WATER FOR IRRIGATION IR SOLN
Status: DC | PRN
  Administered 2023-05-22: 1000 mL

## 2023-05-22 MED ORDER — ACETAMINOPHEN 10 MG/ML IV SOLN
INTRAVENOUS | Status: AC
  Filled 2023-05-22: qty 100

## 2023-05-22 MED ORDER — FENTANYL CITRATE (PF) 100 MCG/2ML IJ SOLN
INTRAMUSCULAR | Status: DC | PRN
  Administered 2023-05-22: 15 ug via INTRATHECAL

## 2023-05-22 MED ORDER — FENTANYL CITRATE (PF) 100 MCG/2ML IJ SOLN: 25.0000 ug | INTRAMUSCULAR | Status: DC | PRN

## 2023-05-22 MED ORDER — MORPHINE SULFATE (PF) 0.5 MG/ML IJ SOLN
INTRAMUSCULAR | Status: DC | PRN
  Administered 2023-05-22: 150 ug via INTRATHECAL

## 2023-05-22 MED ORDER — MORPHINE SULFATE (PF) 0.5 MG/ML IJ SOLN
INTRAMUSCULAR | Status: AC
  Filled 2023-05-22: qty 10

## 2023-05-22 MED ORDER — MEPERIDINE HCL 25 MG/ML IJ SOLN: 6.2500 mg | INTRAMUSCULAR | Status: DC | PRN

## 2023-05-22 MED ORDER — FUROSEMIDE 20 MG PO TABS
20.0000 mg | ORAL_TABLET | Freq: Every day | ORAL | Status: DC
  Administered 2023-05-22 – 2023-05-24 (×3): 20 mg via ORAL
  Filled 2023-05-22 (×3): qty 1

## 2023-05-22 MED ORDER — LIDOCAINE-EPINEPHRINE (PF) 2 %-1:200000 IJ SOLN
INTRAMUSCULAR | Status: DC | PRN
  Administered 2023-05-22: 1.2 mL

## 2023-05-22 MED ORDER — WITCH HAZEL-GLYCERIN EX PADS: 1.0000 | MEDICATED_PAD | CUTANEOUS | Status: DC | PRN

## 2023-05-22 MED ORDER — GABAPENTIN 100 MG PO CAPS
200.0000 mg | ORAL_CAPSULE | Freq: Every day | ORAL | Status: DC
  Administered 2023-05-23: 200 mg via ORAL
  Filled 2023-05-22 (×2): qty 2

## 2023-05-22 SURGICAL SUPPLY — 36 items
ADH SKN CLS APL DERMABOND .7 (GAUZE/BANDAGES/DRESSINGS) ×1
APL PRP STRL LF DISP 70% ISPRP (MISCELLANEOUS) ×2
CANISTER PREVENA PLUS 150 (CANNISTER) IMPLANT
CHLORAPREP W/TINT 26 (MISCELLANEOUS) ×2 IMPLANT
CLAMP UMBILICAL CORD (MISCELLANEOUS) ×1 IMPLANT
CLIP FILSHIE TUBAL LIGA STRL (Clip) IMPLANT
CLOTH BEACON ORANGE TIMEOUT ST (SAFETY) ×1 IMPLANT
DERMABOND ADVANCED .7 DNX12 (GAUZE/BANDAGES/DRESSINGS) ×1 IMPLANT
DRESSING PREVENA PLUS CUSTOM (GAUZE/BANDAGES/DRESSINGS) IMPLANT
DRSG OPSITE POSTOP 4X10 (GAUZE/BANDAGES/DRESSINGS) ×1 IMPLANT
DRSG PREVENA PLUS CUSTOM (GAUZE/BANDAGES/DRESSINGS) ×1
ELECT REM PT RETURN 9FT ADLT (ELECTROSURGICAL) ×1
ELECTRODE REM PT RTRN 9FT ADLT (ELECTROSURGICAL) ×1 IMPLANT
EXTRACTOR VACUUM KIWI (MISCELLANEOUS) ×1 IMPLANT
GAUZE SPONGE 4X4 12PLY STRL LF (GAUZE/BANDAGES/DRESSINGS) IMPLANT
GLOVE BIOGEL PI IND STRL 7.0 (GLOVE) ×3 IMPLANT
GLOVE ECLIPSE 6.5 STRL STRAW (GLOVE) ×1 IMPLANT
GOWN STRL REUS W/ TWL LRG LVL3 (GOWN DISPOSABLE) ×2 IMPLANT
GOWN STRL REUS W/TWL LRG LVL3 (GOWN DISPOSABLE) ×2
MAT PREVALON FULL STRYKER (MISCELLANEOUS) IMPLANT
NS IRRIG 1000ML POUR BTL (IV SOLUTION) ×1 IMPLANT
PAD OB MATERNITY 4.3X12.25 (PERSONAL CARE ITEMS) ×1 IMPLANT
PAD PREP 24X48 CUFFED NSTRL (MISCELLANEOUS) ×1 IMPLANT
RETRACTOR TRAXI PANNICULUS (MISCELLANEOUS) IMPLANT
RETRACTOR WND ALEXIS 25 LRG (MISCELLANEOUS) IMPLANT
RTRCTR WOUND ALEXIS 25CM LRG (MISCELLANEOUS)
SUT MNCRL AB 0 CT1 27 (SUTURE) ×1 IMPLANT
SUT PLAIN 2 0 XLH (SUTURE) ×1 IMPLANT
SUT VIC AB 0 CT1 36 (SUTURE) ×1 IMPLANT
SUT VIC AB 2-0 CT1 27 (SUTURE) ×2
SUT VIC AB 2-0 CT1 TAPERPNT 27 (SUTURE) ×1 IMPLANT
SUT VIC AB 4-0 KS 27 (SUTURE) ×1 IMPLANT
TOWEL OR 17X24 6PK STRL BLUE (TOWEL DISPOSABLE) ×3 IMPLANT
TRAY FOLEY W/BAG SLVR 16FR (SET/KITS/TRAYS/PACK) ×1
TRAY FOLEY W/BAG SLVR 16FR ST (SET/KITS/TRAYS/PACK) ×1 IMPLANT
WATER STERILE IRR 1000ML POUR (IV SOLUTION) ×1 IMPLANT

## 2023-05-22 NOTE — H&P (Signed)
Obstetric Preoperative History and Physical  Anna Welch is a 22 y.o. [phone] with IUP at [redacted]w[redacted]d presenting for scheduled cesarean section.  Reports good fetal movement, no bleeding, no contractions, no leaking of fluid.  No acute preoperative concerns.    Cesarean Section Indication:  cHTN, elective repeat, BMI 49  Prenatal Course Source of Care: [facility]  Pregnancy complications or risks: Patient Active Problem List   Diagnosis Date Noted   S/P repeat low transverse C-section 05/22/2023   BMI 40.0-44.9, adult (HCC) 05/06/2023   Obesity in pregnancy, antepartum, third trimester 05/06/2023   LGA (large for gestational age) fetus affecting management of mother 05/06/2023   History of postpartum hypertension 05/06/2023   History of cesarean section 04/29/2023   Alpha thalassemia silent carrier 02/28/2023   Supervision of high risk pregnancy, antepartum 02/17/2023   Trichomonal vaginitis during pregnancy in second trimester 12/29/2022   Anemia in pregnancy 06/02/2019   Chronic hypertension affecting pregnancy 05/19/2019   Nursing Staff Provider  Office Location [facility] Dating  06/08/2023, by Ultrasound  [facility] Model [x]  Traditional [ ]  Centering [ ]  Mom-Baby Dyad    Language  English Anatomy US  Normal > serial growth scans and testing at 32 weeks   Flu Vaccine  Declined 04/15/23 Genetic/Carrier Screen  NIPS:   Low risk, female  AFP:   too late to care  Horizon: alpha thal carrier   TDaP Vaccine   Defer on 04/15/23 Hgb A1C or  GTT Early A1C: 5.6  Third trimester: Normal  COVID Vaccine    LAB RESULTS   Rhogam  --/--/A POS (05/28 7846)  Blood Type --/--/A POS (05/28 0936)   Baby Feeding Plan Bottle Antibody NEG (05/28 0936)  Contraception Nexplanon Rubella 2.67 (02/26 1053)  Circumcision N/a GIRL RPR NON REACTIVE (05/28 0935)   Pediatrician  [facility] HBsAg Negative (02/26 1053)   Support Person  HCVAb Non Reactive (02/26 1053)   Prenatal Classes  HIV NON REACTIVE  (05/28 0935)     BTL Consent NA GBS Negative/-- (05/14 1205)neg  VBAC Consent **Needs Pap No results found for: "DIAGPAP"       DME Rx [x]  BP cuff [ ]  Weight Scale Waterbirth  [ ]  Class [ ]  Consent [ ]  CNM visit  PHQ9 & GAD7 [x]  new OB [  ] 28 weeks  [  ] 36 weeks Induction  [ ]  Orders Entered [ ] Foley Y/N    She plans to bottle feed She desires  Nexplanon  for postpartum contraception.   Prenatal labs and studies: ABO, Rh: --/--/A POS (05/28 9629) Antibody: NEG (05/28 0936) Rubella: 2.67 (02/26 1053) RPR: NON REACTIVE (05/28 0935)  HBsAg: Negative (02/26 1053)  HIV: NON REACTIVE (05/28 0935)  BMW:UXLKGMWN/-- (05/14 1205) 1 hr Glucola  nml Genetic screening normal Anatomy US normal  Prenatal Transfer Tool  Maternal Diabetes: No Genetic Screening: Normal Maternal Ultrasounds/Referrals: Normal Fetal Ultrasounds or other Referrals:  None Maternal Substance Abuse:  No Significant Maternal Medications:  None Significant Maternal Lab Results: Group B Strep negative  Past Medical History:  Diagnosis Date   Acid reflux    Anemia    Hypertension    Preeclampsia in postpartum period    UTI (urinary tract infection) during pregnancy 05/19/2019   +E. Ferusonii 09/2022; [ ]  TOC    Past Surgical History:  Procedure Laterality Date   CESAREAN SECTION     MULTIPLE TOOTH EXTRACTIONS     TYMPANOSTOMY TUBE PLACEMENT      OB History  Gravida Para Term Preterm AB Living  4 2 2  0 1 2  SAB IAB Ectopic Multiple Live Births  1 0 0 0 2    # Outcome Date GA Lbr Len/2nd Weight Sex Delivery Anes PTL Lv  4 Current           3 Term 08/09/21    F CS-Unspec  N LIV  2 Term 09/08/19 [redacted]w[redacted]d   M CS-LTranv   LIV  1 SAB 05/2018            Social History   Socioeconomic History   Marital status: Single    Spouse name: Not on file   Number of children: Not on file   Years of education: Not on file   Highest education level: Not on file  Occupational History   Not on file  Tobacco Use    Smoking status: Never   Smokeless tobacco: Never  Vaping Use   Vaping Use: Former  Substance and Sexual Activity   Alcohol use: Not Currently   Drug use: Not Currently   Sexual activity: Yes    Birth control/protection: None  Other Topics Concern   Not on file  Social History Narrative   Not on file   Social Determinants of Health   Financial Resource Strain: Unknown (09/14/2019)   Overall Financial Resource Strain (CARDIA)    Difficulty of Paying Living Expenses: Patient declined  Food Insecurity: No Food Insecurity (05/22/2023)   Hunger Vital Sign    Worried About Running Out of Food in the Last Year: Never true    Ran Out of Food in the Last Year: Never true  Transportation Needs: Unmet Transportation Needs (05/22/2023)   PRAPARE - Transportation    Lack of Transportation (Medical): Yes    Lack of Transportation (Non-Medical): Yes  Physical Activity: Unknown (09/14/2019)   Exercise Vital Sign    Days of Exercise per Week: Patient declined    Minutes of Exercise per Session: Patient declined  Stress: Not on file  Social Connections: Unknown (09/14/2019)   Social Connection and Isolation Panel [NHANES]    Frequency of Communication with Friends and Family: Patient declined    Frequency of Social Gatherings with Friends and Family: Patient declined    Attends Religious Services: Patient declined    Database administrator or Organizations: Patient declined    Attends Banker Meetings: Patient declined    Marital Status: Patient declined    Family History  Problem Relation Age of Onset   Hypertension Mother    Stroke Father    Hypertension Father     Medications Prior to Admission  Medication Sig Dispense Refill Last Dose   acetaminophen (TYLENOL) 500 MG tablet Take 500-1,000 mg by mouth every 6 (six) hours as needed (pain.).   05/21/2023 at 1800   ferrous sulfate 325 (65 FE) MG EC tablet Take 1 tablet (325 mg total) by mouth every other day. (Patient  taking differently: Take 325 mg by mouth daily with lunch.) 45 tablet 3 05/21/2023 at 1800   NIFEdipine (PROCARDIA-XL/NIFEDICAL-XL) 30 MG 24 hr tablet Take 1 tablet (30 mg total) by mouth daily. (Patient taking differently: Take 30 mg by mouth daily with lunch.) 60 tablet 2 05/21/2023 at 1800   Prenatal Vit-Fe Fumarate-FA (PREPLUS) 27-1 MG TABS Take 1 tablet by mouth daily. 90 tablet 2 05/21/2023 at 1800    Allergies  Allergen Reactions   Nitrofurantoin Rash    Review of Systems: Pertinent items noted in HPI and  remainder of comprehensive ROS otherwise negative.  Physical Exam: BP (!) 146/85 (BP Location: Left Arm)   Pulse 87   Temp 98.4 F (36.9 C) (Oral)   Resp 18   LMP  (LMP Unknown) Comment: thinks early Sept  SpO2 99%   Breastfeeding No  FHR by Doppler: 136 bpm CONSTITUTIONAL: Well-developed, well-nourished female in no acute distress.  HENT:  Normocephalic, atraumatic, External right and left ear normal. Oropharynx is clear and moist EYES: Conjunctivae and EOM are normal. Pupils are equal, round, and reactive to light. No scleral icterus.  NECK: Normal range of motion, supple, no masses SKIN: Skin is warm and dry. No rash noted. Not diaphoretic. No erythema. No pallor. NEUROLOGIC: Alert and oriented to person, place, and time. Normal reflexes, muscle tone coordination. No cranial nerve deficit noted. PSYCHIATRIC: Normal mood and affect. Normal behavior. Normal judgment and thought content. CARDIOVASCULAR: Normal heart rate noted, regular rhythm RESPIRATORY: Effort and breath sounds normal, no problems with respiration noted ABDOMEN: Soft, nontender, nondistended, gravid. Well-healed Pfannenstiel incision. PELVIC: Deferred MUSCULOSKELETAL: Normal range of motion. No edema and no tenderness. 2+ distal pulses.  Pertinent Labs/Studies:   Results for orders placed or performed during the hospital encounter of 05/20/23 (from the past 72 hour(s))  CBC     Status: Abnormal    Collection Time: 05/20/23  9:35 AM  Result Value Ref Range   WBC 5.6 4.0 - 10.5 K/uL   RBC 4.33 3.87 - 5.11 MIL/uL   Hemoglobin 8.4 (L) 12.0 - 15.0 g/dL    Comment: Reticulocyte Hemoglobin testing may be clinically indicated, consider ordering this additional test [id number]    HCT 29.0 (L) 36.0 - 46.0 %   MCV 67.0 (L) 80.0 - 100.0 fL   MCH 19.4 (L) 26.0 - 34.0 pg   MCHC 29.0 (L) 30.0 - 36.0 g/dL   RDW 62.1 (H) 30.8 - 65.7 %   Platelets 146 (L) 150 - 400 K/uL    Comment: REPEATED TO VERIFY   nRBC 0.0 0.0 - 0.2 %    Comment: Performed at [facility], [address] [place], [place] [address]  RPR     Status: None   Collection Time: 05/20/23  9:35 AM  Result Value Ref Range   RPR Ser Ql NON REACTIVE NON REACTIVE    Comment: Performed at [facility], [address] [place], [place] [address]  Rapid HIV screen (HIV 1/2 Ab+Ag)     Status: None   Collection Time: 05/20/23  9:35 AM  Result Value Ref Range   HIV-1 P24 Antigen - HIV24 NON REACTIVE NON REACTIVE    Comment: (NOTE) Detection of p24 may be inhibited by biotin in the sample, causing false negative results in acute infection.    HIV 1/2 Antibodies NON REACTIVE NON REACTIVE   Interpretation (HIV Ag Ab)      A non reactive test result means that HIV 1 or HIV 2 antibodies and HIV 1 p24 antigen were not detected in the specimen.    Comment: Performed at [facility], [address] [place], [place] [address]  Comprehensive metabolic panel     Status: Abnormal   Collection Time: 05/20/23  9:35 AM  Result Value Ref Range   Sodium 136 135 - 145 mmol/L   Potassium 3.4 (L) 3.5 - 5.1 mmol/L   Chloride 106 98 - 111 mmol/L   CO2 20 (L) 22 - 32 mmol/L   Glucose, Bld 98 70 - 99 mg/dL  Comment: Glucose reference range applies only to samples taken after fasting for at least 8 hours.   BUN <5 (L) 6 - 20 mg/dL   Creatinine, Ser 1.61 0.44 - 1.00 mg/dL   Calcium 9.0 8.9 - 09.6 mg/dL   Total Protein 6.4 (L) 6.5 -  8.1 g/dL   Albumin 2.8 (L) 3.5 - 5.0 g/dL   AST 13 (L) 15 - 41 U/L   ALT 11 0 - 44 U/L   Alkaline Phosphatase 93 38 - 126 U/L   Total Bilirubin 0.4 0.3 - 1.2 mg/dL   GFR, Estimated >04 >54 mL/min    Comment: (NOTE) Calculated using the CKD-EPI Creatinine Equation (2021)    Anion gap 10 5 - 15    Comment: Performed at [facility], [address] [place], [place] [address]  Type and screen     Status: None   Collection Time: 05/20/23  9:36 AM  Result Value Ref Range   ABO/RH(D) A POS    Antibody Screen NEG    Sample Expiration      05/23/2023,2359 Performed at [facility], [address], [place], [place] [address]     Assessment and Plan: Anna Welch is a 21 y.o. [id number] at [redacted]w[redacted]d being admitted for scheduled cesarean section. The risks of surgery were discussed with the patient including but were not limited to: bleeding which may require transfusion or reoperation; infection which may require antibiotics; injury to bowel, bladder, ureters or other surrounding organs; injury to the fetus; need for additional procedures including hysterectomy in the event of a life-threatening hemorrhage; formation of adhesions; placental abnormalities wth subsequent pregnancies; incisional problems; thromboembolic phenomenon and other postoperative/anesthesia complications. The patient concurred with the proposed plan, giving informed written consent for the procedure. Patient has been NPO since last night she will remain NPO for procedure. Anesthesia and OR aware. Preoperative prophylactic antibiotics and SCDs ordered on call to the OR. To OR when ready.    Myrtie Hawk, DO FMOB Fellow, Faculty practice [facility], Center for [facility] Healthcare 05/22/23  8:39 AM   Attestation of Attending Supervision of OB Fellow: Evaluation and management procedures were performed by the Family Medicine OB Fellow under my supervision.  I have reviewed the Fellow's note and chart, and I  agree with the management and plan.  I personally consented the patient for surgery per above and confirmed - She does not desire BTS and plans for inpatient nexplanon - Plans to breastfeed and formula feed  Federico Flake, MD, MPH, ABFM Attending Physician Center for [facility] , [facility]

## 2023-05-22 NOTE — Lactation Note (Signed)
This note was copied from a baby's chart. Lactation Consultation Note  Patient Name: Anna Welch [id number] Date: 05/22/2023 Age:24 hours  Mom wants to be seen by Lactation in am. Mom stated she is tired right now and has formula fed recently. Mom BF her last child for 2 months.   Maternal Data    Feeding Nipple Type: Nfant Standard Flow (white)  LATCH Score                    Lactation Tools Discussed/Used    Interventions    Discharge    Consult Status      Anna Welch 05/22/2023, 11:48 PM

## 2023-05-22 NOTE — Transfer of Care (Addendum)
Immediate Anesthesia Transfer of Care Note  Patient: Anna Welch  Procedure(s) Performed: CESAREAN SECTION  Patient Location: PACU  Anesthesia Type:Spinal  Level of Consciousness: awake, alert , and oriented  Airway & Oxygen Therapy: Patient Spontanous Breathing  Post-op Assessment: Report given to RN and Post -op Vital signs reviewed and stable  Post vital signs: stable  Last Vitals:  Vitals Value Taken Time  BP 155/86 05/22/23 1155  Temp    Pulse 67 05/22/23 1159  Resp 16 05/22/23 1159  SpO2 97 % 05/22/23 1159  Vitals shown include unvalidated device data.  Last Pain:  Vitals:   05/22/23 0813  TempSrc: Oral         Complications: No notable events documented.

## 2023-05-22 NOTE — Anesthesia Procedure Notes (Signed)
Spinal  Patient location during procedure: OR Staffing Performed by: Mal Amabile, MD Authorized by: Mal Amabile, MD   Preanesthetic Checklist Completed: patient identified, IV checked, site marked, risks and benefits discussed, surgical consent, monitors and equipment checked, pre-op evaluation and timeout performed Spinal Block Patient position: sitting Prep: DuraPrep and site prepped and draped Patient monitoring: cardiac monitor, continuous pulse ox, blood pressure and heart rate Approach: midline Location: L3-4 Injection technique: catheter Needle Needle type: Tuohy  Needle length: 12.7 cm Needle insertion depth: 7 cm Catheter type: closed end flexible Catheter size: 19 g Assessment Sensory level: T4 Events: CSF return Additional Notes Epidural performed using LOR with air technique. No Heme or paresthesias. CSF obtained with 17ga Touhy needle. CSF clear with free flow and no paresthesias. Marland KitchenEpidural catheter threaded 4cm into the intrathecal space and the epidural needle was withdrawn. Epidural catheter threaded into the intrathecal space. Local anesthetic and narcotics injected through the  epidural catheter. A sterile dressing was applied and the patient placed supine with LUD. The patient tolerated the procedure well and adequate sensory level was obtained.

## 2023-05-22 NOTE — Anesthesia Postprocedure Evaluation (Signed)
Anesthesia Post Note  Patient: Anna Welch  Procedure(s) Performed: CESAREAN SECTION     Patient location during evaluation: PACU Anesthesia Type: Spinal Level of consciousness: oriented and awake and alert Pain management: pain level controlled Vital Signs Assessment: post-procedure vital signs reviewed and stable Respiratory status: spontaneous breathing, respiratory function stable and nonlabored ventilation Cardiovascular status: blood pressure returned to baseline and stable Postop Assessment: no headache, no backache, no apparent nausea or vomiting, spinal receding and patient able to bend at knees Anesthetic complications: no   No notable events documented.  Last Vitals:  Vitals:   05/22/23 1245 05/22/23 1308  BP: (!) 146/83 136/81  Pulse: 65 71  Resp: 14 16  Temp:  37 C  SpO2: 99% 98%    Last Pain:  Vitals:   05/22/23 1310  TempSrc:   PainSc: 0-No pain   Pain Goal: Patients Stated Pain Goal: 3 (05/22/23 1245)              Epidural/Spinal Function Cutaneous sensation: Able to Wiggle Toes (05/22/23 1310), Patient able to flex knees: Yes (05/22/23 1310), Patient able to lift hips off bed: Yes (05/22/23 1310), Back pain beyond tenderness at insertion site: No (05/22/23 1310), Progressively worsening motor and/or sensory loss: No (05/22/23 1310), Bowel and/or bladder incontinence post epidural: No (05/22/23 1310)  Casandra Dallaire A.

## 2023-05-22 NOTE — Op Note (Addendum)
[facility] PROCEDURE DATE: 05/22/2023  PREOPERATIVE DIAGNOSES: Intrauterine pregnancy at [redacted]w[redacted]d weeks gestation;  elective repeat, cHTN, BMI 49  POSTOPERATIVE DIAGNOSES: The same, viable infant delivered  PROCEDURE: Repeat Low Transverse Cesarean Section  SURGEON:  Dr. Lyndel Safe MD  ASSISTANT:  Myrtie Hawk, DO An experienced assistant was required given the standard of surgical care given the complexity of the case.  This assistant was needed for exposure, dissection, suctioning, retraction, instrument exchange, assisting with delivery with administration of fundal pressure, and for overall help during the procedure.  ANESTHESIOLOGY TEAM: Anesthesiologist: Mal Amabile, MD CRNA: Graciela Husbands, CRNA  INDICATIONS: Anna Welch is a 22 y.o. [phone] at [redacted]w[redacted]d here for cesarean section secondary to the indications listed under preoperative diagnoses; please see preoperative note for further details.  The risks of surgery were discussed with the patient including but were not limited to: bleeding which may require transfusion or reoperation; infection which may require antibiotics; injury to bowel, bladder, ureters or other surrounding organs; injury to the fetus; need for additional procedures including hysterectomy in the event of a life-threatening hemorrhage; formation of adhesions; placental abnormalities wth subsequent pregnancies; incisional problems; thromboembolic phenomenon and other postoperative/anesthesia complications.  The patient concurred with the proposed plan, giving informed written consent for the procedure.    FINDINGS:  Viable female infant in cephalic presentation-- was transferred to NICU for TTN.  Apgars 8 and 8.  Amniotic fluid: clear.  Intact placenta, three vessel cord.  Normal uterus, fallopian tubes and ovaries bilaterally. Moderate adhesive disease, bladder was scarred to LUE but able to be reduced. There were multiple bands but only one needed  ligation due to position near hysterotomy location.   ANESTHESIA: spinal, chloroprocaine INTRAVENOUS FLUIDS: 1700 ml   ESTIMATED BLOOD LOSS: 284 ml URINE OUTPUT:  100 ml SPECIMENS: Placenta sent to L&D . COMPLICATIONS: None immediate  PROCEDURE IN DETAIL:  The patient preoperatively received intravenous antibiotics and had sequential compression devices applied to her lower extremities.  She was then taken to the operating room where spinal anesthesia was found to be adequate. She was then placed in a dorsal supine position with a leftward tilt, and prepped and draped in a sterile manner.  A foley catheter was  placed into her bladder and attached to constant gravity.  After an adequate timeout was performed, a Pfannenstiel skin incision was made with scalpel and carried through to the underlying layer of fascia. The fascia was incised in the midline, and this incision was extended sharply with mayo scissors. The rectus muscles were separated in the midline and the peritoneum was entered bluntly. Patient began to feel the procedure and there was a brief pause to allow anesthesia to dose additional medications.   Adhesive disease was noted upon entering the peritoneum. Anterior lower uterine adhesive tissues was ligated and bovied. The Alexis self-retaining retractor was introduced into the abdominal cavity.  A bladder flap was made in the lower uterine segment. Attention was turned to the lower uterine segment where a low transverse hysterotomy was made with a scalpel and extended bluntly in caudad and cephalad directions.  The infant was successfully delivered in vertex position, the cord was clamped and cut after one minute, and the infant was handed over to the awaiting neonatology team. Uterine massage was then administered, and the placenta delivered intact with a three-vessel cord. The uterus was then cleared of clots and debris.  The hysterotomy was closed with 0-Monocryl in a running fashion.    The pelvis  was cleared of all clot and debris. Hemostasis was confirmed on all surfaces.  The uterus was once again inspected and found to be hemostatic. The retractor was removed.  Administered chloroprocaine. The peritoneum was closed with a 2-0 Vicryl running stitch. The fascia was then closed using 0 Monocryl in a running fashion.  The subcutaneous layer was irrigated, any areas of bleeding were cauterized with the bovie,  was reapproximated with 2-0 plain gut in a running fashion, was found to be hemostatic.. . The skin was closed with a 4-0 Monocryl subcuticular stitch. The patient tolerated the procedure well. Sponge, instrument and needle counts were correct x 3.  She was taken to the recovery room in stable condition.   Myrtie Hawk, DO FMOB Fellow, Faculty practice [facility], Center for [facility] 05/22/23  11:25 AM   Attestation of Attending Supervision of OB Fellow: Evaluation and management procedures were performed by the Family Medicine OB Fellow under my supervision.  I have reviewed the Fellow's note and chart. I was gloved and gowned for the entirety of the procedure and involved during the case. I have made any necessary editorial changes.    Federico Flake, MD, MPH, ABFM Attending Physician Center for [facility]

## 2023-05-22 NOTE — Discharge Summary (Signed)
Postpartum Discharge Summary     Patient Name: Anna Welch DOB: 26-Aug-2001 MRN: [id number]  Date of admission: 05/22/2023 Delivery date:05/22/2023  Delivering provider: Federico Flake  Date of discharge: 05/24/2023  Admitting diagnosis: S/P repeat low transverse C-section [Z98.891] Status post repeat low transverse cesarean section [Z98.891] Intrauterine pregnancy: [redacted]w[redacted]d     Secondary diagnosis:  Principal Problem:   S/P repeat low transverse C-section Active Problems:   Chronic hypertension affecting pregnancy   Anemia in pregnancy   Supervision of high risk pregnancy, antepartum   History of cesarean section   Obesity in pregnancy, antepartum, third trimester   Status post repeat low transverse cesarean section  Additional problems: as above    Discharge diagnosis: Term Pregnancy Delivered and CHTN                                              Post partum procedures: n/a Augmentation: N/A Complications: None  Hospital course: Sceduled C/S   22 y.o. yo [id number] at [redacted]w[redacted]d was admitted to the hospital 05/22/2023 for scheduled cesarean section with the following indication:Elective Repeat.Delivery details are as follows:  Membrane Rupture Time/Date: 10:44 AM ,05/22/2023   Delivery Method:C-Section, Low Transverse  Details of operation can be found in separate operative note.  Patient had a postpartum course complicated by anemia which she was continued on oral iron. Hx of venofer x2 (most recent 1 week prior to delivery). For her blood pressure she was discharged on procardia and lasix with stable BP readings.  She is ambulating, tolerating a regular diet, passing flatus, and urinating well. Patient is discharged home in stable condition on  05/24/23        Newborn Data: Birth date:05/22/2023  Birth time:10:46 AM  Gender:Female  Living status:Living  Apgars:8 ,8  Weight:3430 g     Magnesium Sulfate received: No BMZ received: No Rhophylac:N/A MMR:N/A T-DaP:  Deferred Flu: No Transfusion:No  Physical exam  Vitals:   05/23/23 0450 05/23/23 1410 05/23/23 2133 05/24/23 0546  BP: 130/70 139/66 (!) 124/59 127/69  Pulse: (!) 55 69  76  Resp: 16 17 18 18   Temp: 98.1 F (36.7 C) 97.9 F (36.6 C) 98.3 F (36.8 C) 98.4 F (36.9 C)  TempSrc: Oral Oral Oral Oral  SpO2: 99%  100% 100%   General: alert, cooperative, and no distress Lochia: appropriate Uterine Fundus: firm Incision: Healing well with no significant drainage, No significant erythema, Dressing is clean, dry, and intact DVT Evaluation: No evidence of DVT seen on physical exam. Negative Homan's sign. No cords or calf tenderness. No significant calf/ankle edema. Labs: Lab Results  Component Value Date   WBC 9.0 05/23/2023   HGB 7.9 (L) 05/23/2023   HCT 26.8 (L) 05/23/2023   MCV 66.8 (L) 05/23/2023   PLT 193 05/23/2023      Latest Ref Rng & Units 05/20/2023    9:35 AM  CMP  Glucose 70 - 99 mg/dL 98   BUN 6 - 20 mg/dL <5   Creatinine 1.47 - 1.00 mg/dL 8.29   Sodium 562 - 130 mmol/L 136   Potassium 3.5 - 5.1 mmol/L 3.4   Chloride 98 - 111 mmol/L 106   CO2 22 - 32 mmol/L 20   Calcium 8.9 - 10.3 mg/dL 9.0   Total Protein 6.5 - 8.1 g/dL 6.4   Total Bilirubin 0.3 - 1.2 mg/dL 0.4   Alkaline  Phos 38 - 126 U/L 93   AST 15 - 41 U/L 13   ALT 0 - 44 U/L 11    Edinburgh Score:     No data to display           After visit meds:  Allergies as of 05/24/2023       Reactions   Nitrofurantoin Rash        Medication List     TAKE these medications    acetaminophen 500 MG tablet Commonly known as: TYLENOL Take 500-1,000 mg by mouth every 6 (six) hours as needed (pain.).   ferrous sulfate 325 (65 FE) MG EC tablet Take 1 tablet (325 mg total) by mouth every other day. What changed: when to take this   furosemide 20 MG tablet Commonly known as: LASIX Take 1 tablet (20 mg total) by mouth daily for 5 days.   ibuprofen 600 MG tablet Commonly known as: ADVIL Take 1  tablet (600 mg total) by mouth every 6 (six) hours.   NIFEdipine 30 MG 24 hr tablet Commonly known as: PROCARDIA-XL/NIFEDICAL-XL Take 1 tablet (30 mg total) by mouth daily. What changed: when to take this   oxyCODONE 5 MG immediate release tablet Commonly known as: Oxy IR/ROXICODONE Take 1 tablet (5 mg total) by mouth every 4 (four) hours as needed for moderate pain.   PrePLUS 27-1 MG Tabs Take 1 tablet by mouth daily.       Discharge home in stable condition Infant Feeding: Bottle and Breast Infant Disposition:home with mother Discharge instruction: per After Visit Summary and Postpartum booklet. Activity: Advance as tolerated. Pelvic rest for 6 weeks.  Diet: routine diet Future Appointments:No future appointments. Follow up Visit: Message [facility] to [facility] 5/30  Please schedule this patient for a In person postpartum visit in 6 weeks with the following provider: Any provider. Additional Postpartum F/U:Incision check 1 week and BP check 1 week  High risk pregnancy complicated by: HTN Delivery mode:  C-Section, Low Transverse  Anticipated Birth Control:  Nexplanon   05/24/2023 [name], DO

## 2023-05-23 ENCOUNTER — Encounter (HOSPITAL_COMMUNITY): Payer: Self-pay | Admitting: Anesthesiology

## 2023-05-23 LAB — CBC
HCT: 26.8 % — ABNORMAL LOW (ref 36.0–46.0)
Hemoglobin: 7.9 g/dL — ABNORMAL LOW (ref 12.0–15.0)
MCH: 19.7 pg — ABNORMAL LOW (ref 26.0–34.0)
MCHC: 29.5 g/dL — ABNORMAL LOW (ref 30.0–36.0)
MCV: 66.8 fL — ABNORMAL LOW (ref 80.0–100.0)
Platelets: 193 10*3/uL (ref 150–400)
RBC: 4.01 MIL/uL (ref 3.87–5.11)
RDW: 21.2 % — ABNORMAL HIGH (ref 11.5–15.5)
WBC: 9 10*3/uL (ref 4.0–10.5)
nRBC: 0 % (ref 0.0–0.2)

## 2023-05-23 MED ORDER — FERROUS SULFATE 325 (65 FE) MG PO TABS: 325.0000 mg | ORAL_TABLET | Freq: Every day | ORAL | Status: DC

## 2023-05-23 MED ORDER — FERROUS SULFATE 325 (65 FE) MG PO TABS
325.0000 mg | ORAL_TABLET | Freq: Every day | ORAL | Status: DC
  Administered 2023-05-23 – 2023-05-24 (×2): 325 mg via ORAL
  Filled 2023-05-23 (×2): qty 1

## 2023-05-23 NOTE — Progress Notes (Signed)
POSTPARTUM PROGRESS NOTE  POD #1  Subjective:  Anna Welch is a 22 y.o. [phone] s/p rLTCS at [redacted]w[redacted]d.  She reports she doing well. No acute events overnight. She reports she is doing well. She denies any problems with ambulating, voiding or po intake. Denies nausea or vomiting. She has passed flatus. Pain is well controlled.  Lochia is appropriate.  Objective: Blood pressure 130/70, pulse (!) 55, temperature 98.1 F (36.7 C), temperature source Oral, resp. rate 16, SpO2 99 %, unknown if currently breastfeeding.  Physical Exam:  General: alert, cooperative and no distress Chest: no respiratory distress Heart:regular rate, distal pulses intact Abdomen: soft, nontender,  Uterine Fundus: firm, appropriately tender DVT Evaluation: No calf swelling or tenderness Extremities: No LE edema Skin: warm, dry; incision cover with prevena dressing and vacuum on and working  Recent Labs    05/20/23 0935 05/23/23 0511  HGB 8.4* 7.9*  HCT 29.0* 26.8*    Assessment/Plan: Anna Welch is a 22 y.o. [id number] s/p elective repeat LTCS at [redacted]w[redacted]d.  POD#1 - Doing well; pain well controlled. H/H appropriate  Routine postpartum care  OOB, ambulated  Lovenox for VTE prophylaxis Anemia: asymptomatic  Venofer x2 1& 2 weeks prior to delivery. Continue on oral iron.  Contraception: Nexplanon Feeding: Breast  Dispo: Plan for discharge 6/1-6/2.   LOS: 1 day   Lavonda Jumbo, DO OB Fellow, Faculty Practice [facility], Center for [facility] 05/23/2023, 7:18 AM

## 2023-05-23 NOTE — Lactation Note (Signed)
This note was copied from a baby's chart. Lactation Consultation Note  Patient Name: Anna Welch [id number] Date: 05/23/2023 Age:22 years  Reason for consult: Initial assessment;Exclusive pumping and bottle feeding;Early term 37-38.6wks  P3, GA [redacted]w[redacted]d, early term infant  Mother was receptive to [facility] visit. Mother has been formula feeding baby (last feeding 45 mL) and states she wants to pump and give baby colostrum. Mother does not want to latch baby to breast.   DEBP set up with education regarding use, frequency, cleaning and storage of EBM. Mother plans to continue to formula feed while her milk is coming to volume. Instructed to give her EBM first and discussed syringe feeding when small volumes. Mother has colostrum when she hand expressed. She has a DEBP at home (from insurance).  Instructed to request assistance as needed. Mom made aware of O/P services, breastfeeding support groups, and our phone # for post-discharge questions.     Maternal Data Has patient been taught Hand Expression?: Yes Does the patient have breastfeeding experience prior to this delivery?: Yes How long did the patient breastfeed?: Mother reports pumping for 1 month with her 2nd child  Feeding Mother's Current Feeding Choice: Formula  LATCH Score  Does not intend to feed baby at breast  Lactation Tools Discussed/Used Tools: Pump;Flanges Flange Size: 21 Breast pump type: Double-Electric Breast Pump Pump Education: Setup, frequency, and cleaning;Milk Storage Reason for Pumping: early term, mother wants to give colostrum Pumping frequency: instructed to pump every 3 hrs for 15 min in the initiation phase ( 8 times/ 24 hrs)  Interventions Interventions: Hand express;Hand pump;DEBP;Education;LC Services brochure  Discharge Discharge Education: Engorgement and breast care Pump: DEBP;Personal  Consult Status Consult Status: Follow-up Date: 05/24/23 Follow-up type: In-patient    Christella Hartigan  M 05/23/2023, 9:17 AM

## 2023-05-24 ENCOUNTER — Other Ambulatory Visit (HOSPITAL_COMMUNITY): Payer: Self-pay

## 2023-05-24 ENCOUNTER — Encounter (HOSPITAL_COMMUNITY): Payer: Self-pay | Admitting: Anesthesiology

## 2023-05-24 ENCOUNTER — Encounter: Payer: Self-pay | Admitting: Obstetrics and Gynecology

## 2023-05-24 DIAGNOSIS — Z30017 Encounter for initial prescription of implantable subdermal contraceptive: Secondary | ICD-10-CM

## 2023-05-24 LAB — BPAM RBC
Blood Product Expiration Date: 202406202359
Unit Type and Rh: 6200
Unit Type and Rh: 6200

## 2023-05-24 LAB — TYPE AND SCREEN
Unit division: 0
Unit division: 0

## 2023-05-24 MED ORDER — LIDOCAINE HCL 1 % IJ SOLN
INTRAMUSCULAR | Status: AC
  Administered 2023-05-24: 20 mL
  Filled 2023-05-24: qty 20

## 2023-05-24 MED ORDER — FUROSEMIDE 20 MG PO TABS
20.0000 mg | ORAL_TABLET | Freq: Every day | ORAL | 0 refills | Status: DC
  Filled 2023-05-24: qty 5, 5d supply, fill #0

## 2023-05-24 MED ORDER — ETONOGESTREL 68 MG ~~LOC~~ IMPL
68.0000 mg | DRUG_IMPLANT | Freq: Once | SUBCUTANEOUS | Status: AC
  Administered 2023-05-24: 68 mg via SUBCUTANEOUS
  Filled 2023-05-24: qty 1

## 2023-05-24 MED ORDER — IBUPROFEN 600 MG PO TABS
600.0000 mg | ORAL_TABLET | Freq: Four times a day (QID) | ORAL | 0 refills | Status: DC
  Filled 2023-05-24: qty 30, 8d supply, fill #0

## 2023-05-24 MED ORDER — OXYCODONE HCL 5 MG PO TABS
5.0000 mg | ORAL_TABLET | ORAL | 0 refills | Status: DC | PRN
  Filled 2023-05-24: qty 15, 3d supply, fill #0

## 2023-05-24 MED ORDER — LIDOCAINE HCL 1 % IJ SOLN: 0.0000 mL | Freq: Once | INTRAMUSCULAR | Status: DC | PRN

## 2023-05-24 NOTE — Procedures (Signed)

## 2023-05-27 ENCOUNTER — Encounter: Payer: Self-pay | Admitting: Obstetrics and Gynecology

## 2023-05-28 ENCOUNTER — Other Ambulatory Visit: Payer: Self-pay

## 2023-05-28 ENCOUNTER — Ambulatory Visit (INDEPENDENT_AMBULATORY_CARE_PROVIDER_SITE_OTHER): Payer: Commercial Managed Care - HMO | Admitting: *Deleted

## 2023-05-28 ENCOUNTER — Encounter: Payer: Self-pay | Admitting: Obstetrics and Gynecology

## 2023-05-28 VITALS — BP 155/87 | HR 77 | Ht 69.0 in | Wt 319.3 lb

## 2023-05-28 DIAGNOSIS — Z4889 Encounter for other specified surgical aftercare: Secondary | ICD-10-CM

## 2023-05-28 DIAGNOSIS — O10919 Unspecified pre-existing hypertension complicating pregnancy, unspecified trimester: Secondary | ICD-10-CM

## 2023-05-28 MED ORDER — NIFEDIPINE ER 60 MG PO TB24
60.0000 mg | ORAL_TABLET | Freq: Every day | ORAL | 1 refills | Status: AC
Start: 2023-05-28 — End: ?

## 2023-05-28 NOTE — Progress Notes (Signed)
Pt presents for wound vac removal and incision check - s/p Repeat C/S on 05/22/23. She is observed to be walking well and reports no pain. Pt has elevated BP of 155/87. She denies H/A or visual disturbances and is taking Nifedipine as prescribed. Wound vac was removed and incision found to be healing well. No redness, swelling or drainage observed. Several areas noted wherein the skin has not completely closed. Dr. Alvester Morin notified of incision status and BP result.

## 2023-06-04 ENCOUNTER — Ambulatory Visit: Payer: Commercial Managed Care - HMO

## 2023-07-04 IMAGING — US US ABDOMEN LIMITED
1 series · 16 of 25 positions shown · non-contrast
Comparison: None.

CLINICAL DATA: Epigastric pain

EXAM:
ULTRASOUND ABDOMEN LIMITED RIGHT UPPER QUADRANT

[Series 1: us abdomen limited · 83 acquisitions, 16 frames shown]
[im 1/83]
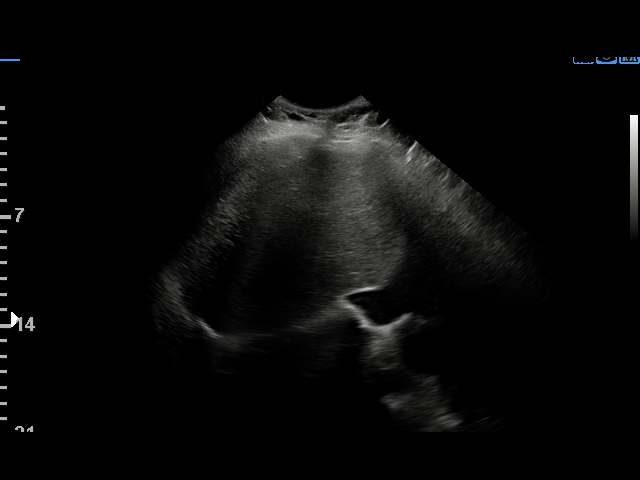
[im 7/83]
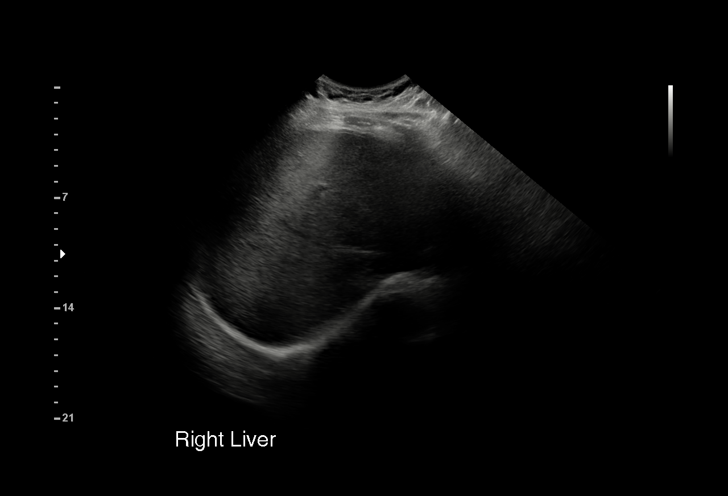
[im 11/83]
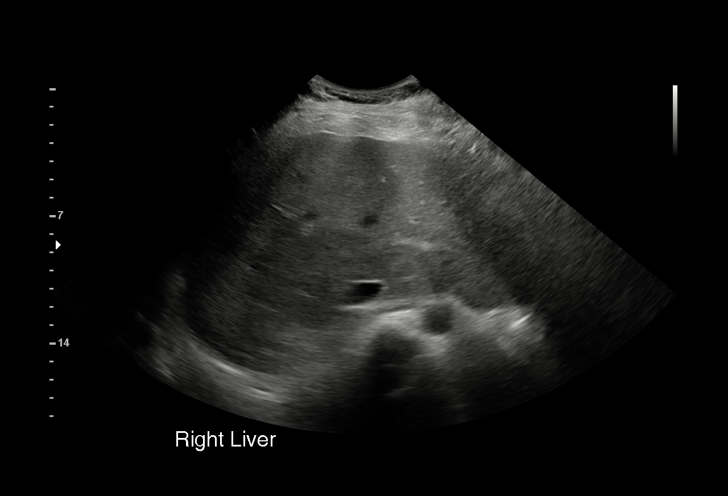
[im 18/83]
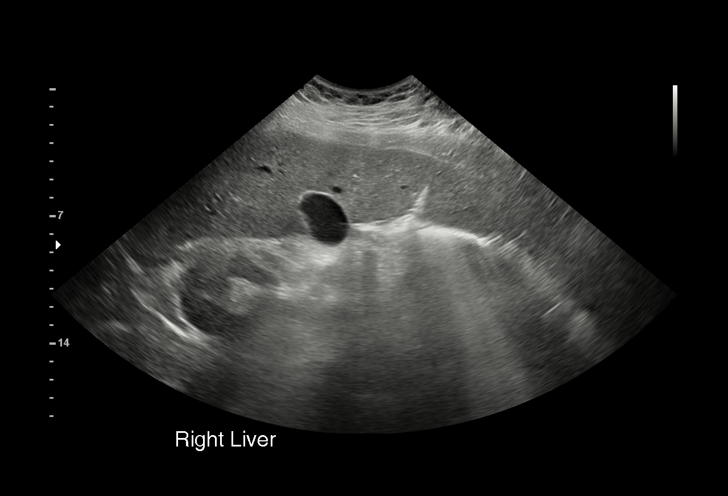
[im 24/83]
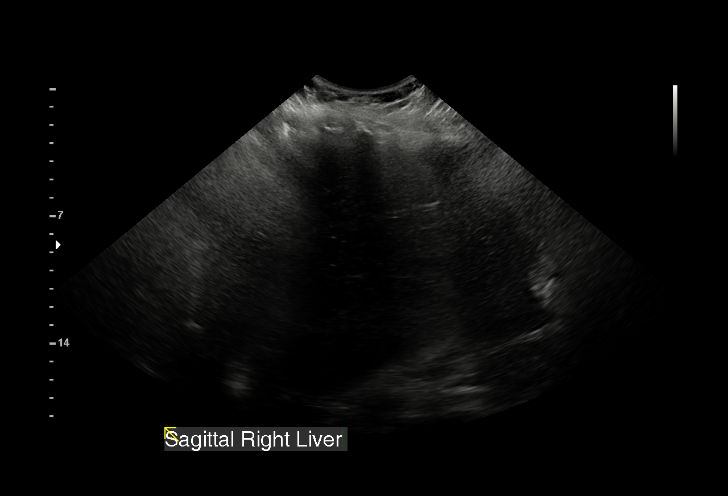
[im 28/83]
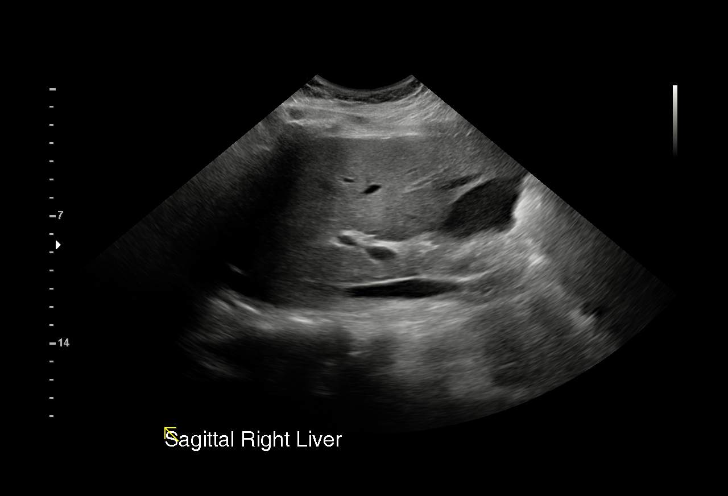
[im 35/83]
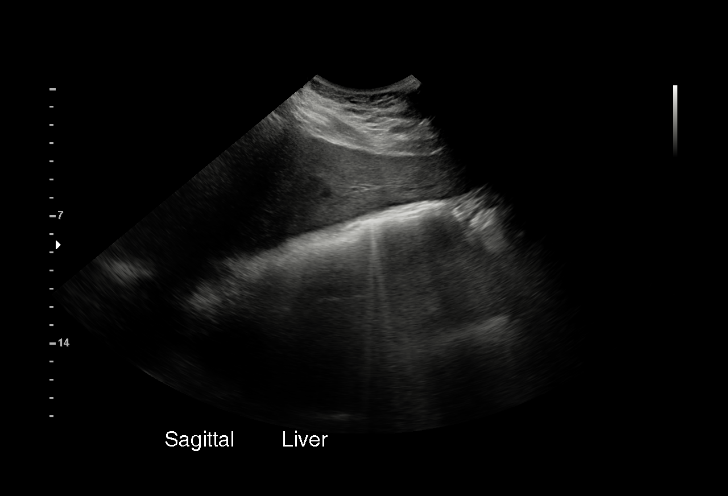
[im 38/83]
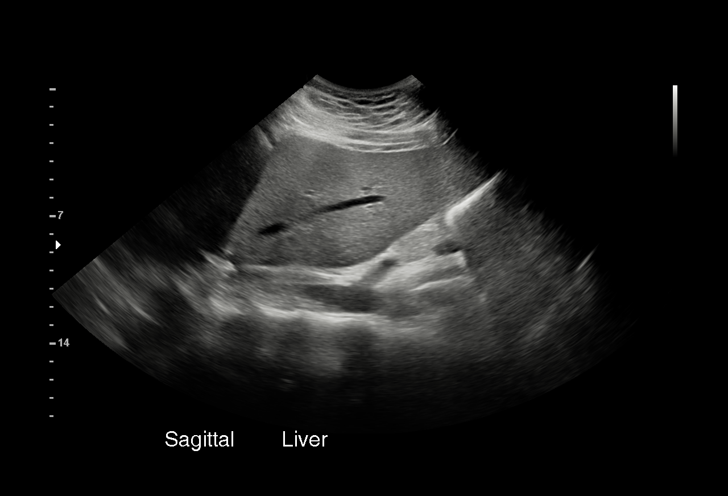
[im 45/83]
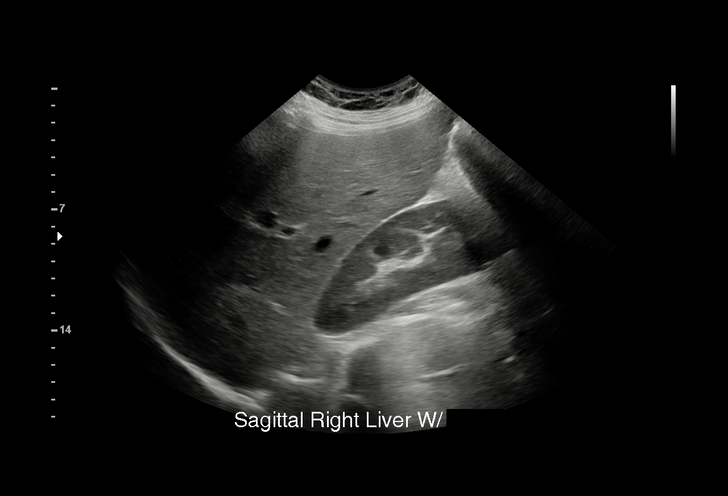
[im 48/83]
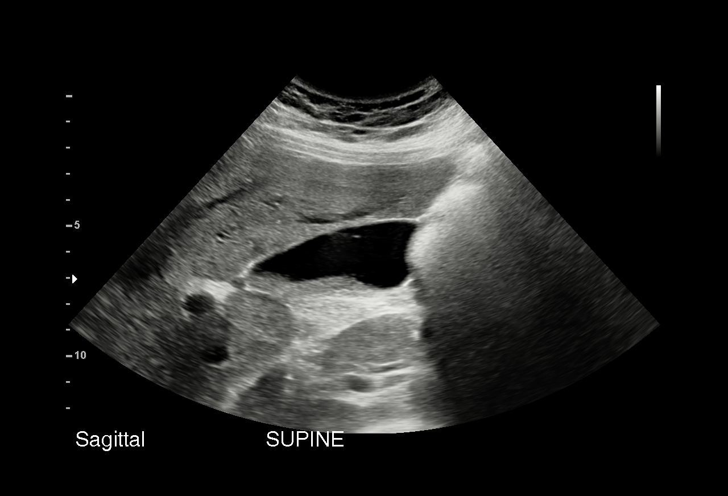
[im 55/83]
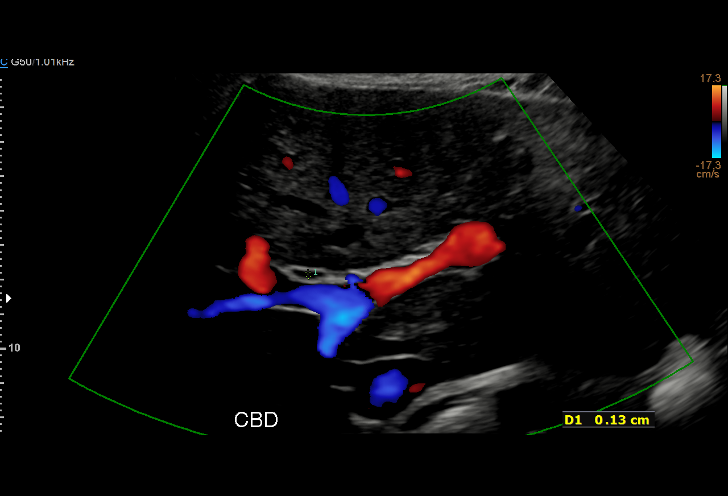
[im 59/83]
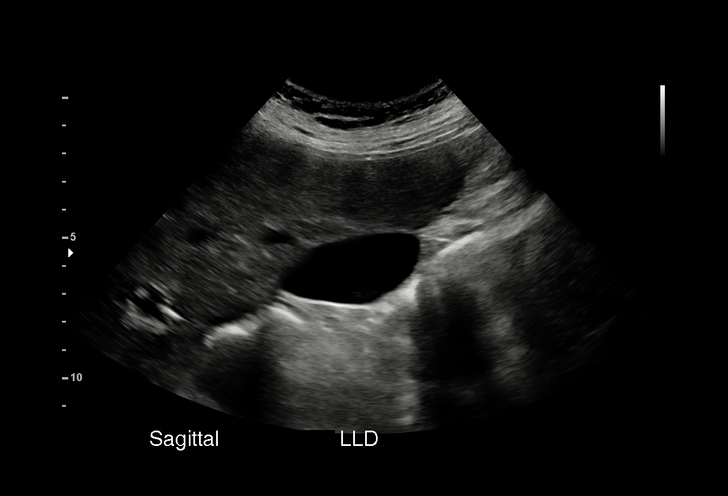
[im 65/83]
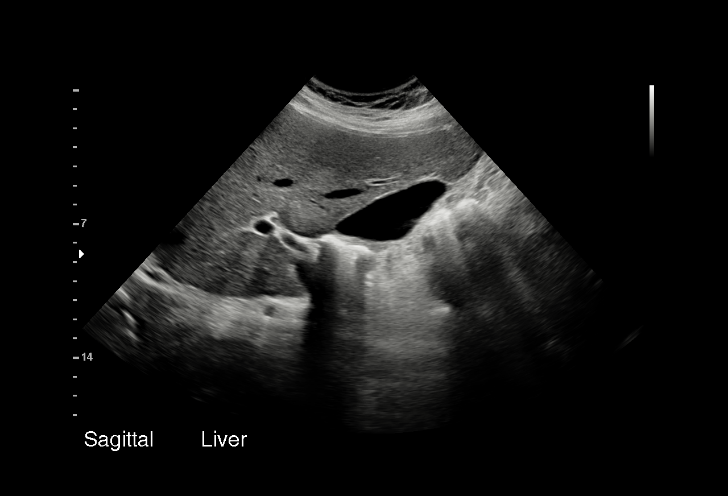
[im 72/83]
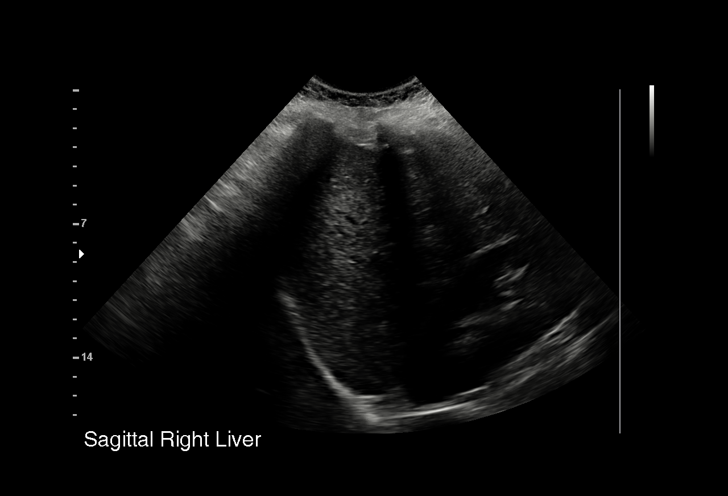
[im 76/83]
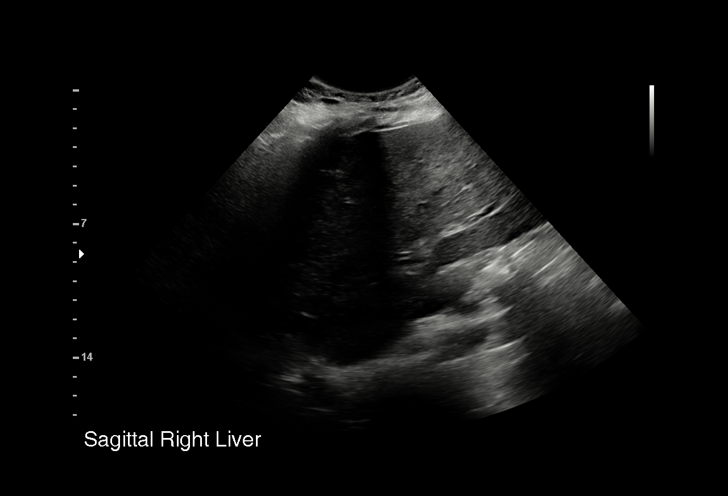
[im 83/83]
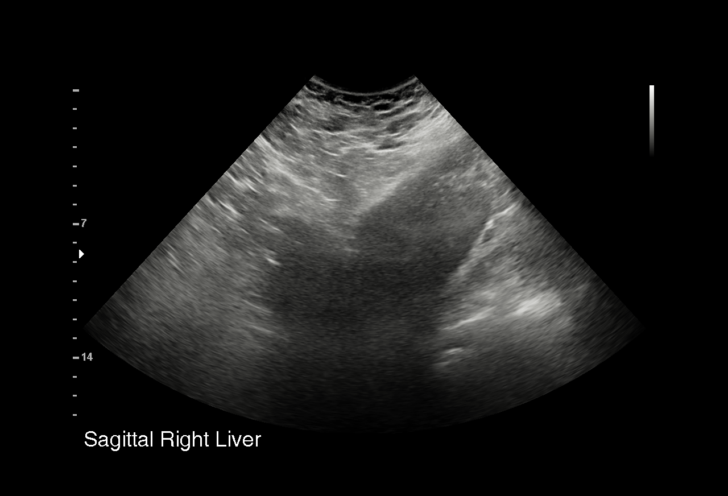

[16 of 25 positions shown; findings below may reference images not displayed]

FINDINGS: Gallbladder:

No gallstones or wall thickening visualized. No sonographic Murphy
sign noted by sonographer.

Common bile duct:

Diameter: 2 mm

Liver:

No focal lesion identified. Within normal limits in parenchymal
echogenicity. Portal vein is patent on color Doppler imaging with
normal direction of blood flow towards the liver.

Other: None.
IMPRESSION: 1. Unremarkable right upper quadrant ultrasound.

## 2023-07-07 ENCOUNTER — Ambulatory Visit (INDEPENDENT_AMBULATORY_CARE_PROVIDER_SITE_OTHER): Payer: Commercial Managed Care - HMO | Admitting: Obstetrics and Gynecology

## 2023-07-07 ENCOUNTER — Other Ambulatory Visit: Payer: Self-pay

## 2023-07-07 ENCOUNTER — Other Ambulatory Visit (HOSPITAL_COMMUNITY)
Admission: RE | Admit: 2023-07-07 | Discharge: 2023-07-07 | Disposition: A | Discharge status: Home / Self Care | Payer: Commercial Managed Care - HMO | Source: Ambulatory Visit | Attending: Obstetrics and Gynecology | Admitting: Obstetrics and Gynecology

## 2023-07-07 DIAGNOSIS — Z8619 Personal history of other infectious and parasitic diseases: Secondary | ICD-10-CM | POA: Diagnosis not present

## 2023-07-07 DIAGNOSIS — Z113 Encounter for screening for infections with a predominantly sexual mode of transmission: Secondary | ICD-10-CM | POA: Insufficient documentation

## 2023-07-07 DIAGNOSIS — B3731 Acute candidiasis of vulva and vagina: Secondary | ICD-10-CM | POA: Insufficient documentation

## 2023-07-07 DIAGNOSIS — A5901 Trichomonal vulvovaginitis: Secondary | ICD-10-CM | POA: Insufficient documentation

## 2023-07-07 DIAGNOSIS — Z975 Presence of (intrauterine) contraceptive device: Secondary | ICD-10-CM | POA: Diagnosis not present

## 2023-07-07 NOTE — Progress Notes (Signed)
Post Partum Visit Note  Anna Welch is a 22 y.o. [phone] female who presents for a postpartum visit. She is 6 weeks 4 days postpartum following a primary cesarean section.  I have fully reviewed the prenatal and intrapartum course. The delivery was at 37 gestational weeks nd 4 days.  Anesthesia: epidural. Postpartum course has been good. Baby is doing well. Baby is feeding by bottle - Similac Sensitive RS. Bleeding staining only. Bowel function is normal. Bladder function is normal. Contraception method is Nexplanon. Postpartum depression screening: negative.  Still having pain at nexplanon site - when lifting and bathing children will have pain at the site of insertion and it feels like it's bent    The pregnancy intention screening data noted above was reviewed. Potential methods of contraception were discussed. The patient elected to proceed with No data recorded.   Edinburgh Postnatal Depression Scale - 07/07/23 1434       Edinburgh Postnatal Depression Scale:  In the Past 7 Days   I have been able to laugh and see the funny side of things. 0    I have looked forward with enjoyment to things. 0    I have blamed myself unnecessarily when things went wrong. 0    I have been anxious or worried for no good reason. 0    I have felt scared or panicky for no good reason. 0    Things have been getting on top of me. 0    I have been so unhappy that I have had difficulty sleeping. 0    I have felt sad or miserable. 0    I have been so unhappy that I have been crying. 0    The thought of harming myself has occurred to me. 0    Edinburgh Postnatal Depression Scale Total 0             Health Maintenance Due  Topic Date Due   HPV VACCINES (1 - 3-dose series) Never done   PAP-Cervical Cytology Screening  Never done   PAP SMEAR-Modifier  Never done   COVID-19 Vaccine (1 - 2023-24 season) Never done    The following portions of the patient's history were reviewed and updated as  appropriate: allergies, current medications, past family history, past medical history, past social history, past surgical history, and problem list.  Review of Systems Pertinent items are noted in HPI.  Objective:  BP (!) 145/86   Pulse 71   Wt 299 lb 3.2 oz (135.7 kg)   LMP  (LMP Unknown) Comment: thinks early Sept  Breastfeeding No   BMI 44.18 kg/m    General:  alert, cooperative, and no distress   Breasts:  not indicated  Lungs: Normal respiratory effort  MSK:  Nexplanon palpated in upper arm with fold in middle, unclear if intact  Abdomen: soft, non-tender; bowel sounds normal; no masses,  no organomegaly   Wound well approximated incision  GU exam:  not indicated       Assessment:   1. Postpartum state Dign well  2. Nexplanon in place Concern nexplanon broken, xray to assess if intact or not - DG Humerus Left; Future  3. History of trichomoniasis Repeat swab today - Cervicovaginal ancillary only   routine postpartum exam.   Plan:   Essential components of care per ACOG recommendations:  1.  Mood and well being: Patient with negative depression screening today. Reviewed local resources for support.  - Patient tobacco use? No.   -  hx of drug use? No.    2. Infant care and feeding:  -Patient currently breastmilk feeding? No.  -Social determinants of health (SDOH) reviewed in EPIC. No concerns  3. Sexuality, contraception and birth spacing - Patient does not want a pregnancy in the next year.  Desired family size is 4 children.  - Reviewed reproductive life planning. Reviewed contraceptive methods based on pt preferences and effectiveness.  Patient desired Hormonal Implant today.   - Discussed birth spacing of 18 months  4. Sleep and fatigue -Encouraged family/partner/community support of 4 hrs of uninterrupted sleep to help with mood and fatigue  5. Physical Recovery  - Discussed patients delivery and complications. She describes her labor as good. -  Patient had a C-section repeat; no problems after deliver. Patient had a  n/a  laceration. Perineal healing reviewed. Patient expressed understanding - Patient has urinary incontinence? No. - Patient is safe to resume physical and sexual activity  6.  Health Maintenance - HM due items addressed Yes - Last pap smear No results found for: "DIAGPAP" Pap smear not done at today's visit.  -Breast Cancer screening indicated? No.   7. Chronic Disease/Pregnancy Condition follow up:  pap - offered, declined for now  - PCP follow up  B'Aisha Maricela Bo, CMA Center for [employer], [facility]  Lorriane Shire, MD, FACOG Minimally Invasive Gynecologic Surgery  Obstetrics and Gynecology, [facility] for [facility], [facility] 07/22/2023

## 2023-07-09 ENCOUNTER — Encounter: Payer: Self-pay | Admitting: Obstetrics and Gynecology

## 2023-07-09 LAB — CERVICOVAGINAL ANCILLARY ONLY
Bacterial Vaginitis (gardnerella): NEGATIVE
Candida Glabrata: NEGATIVE
Candida Vaginitis: POSITIVE — AB
Chlamydia: NEGATIVE
Comment: NEGATIVE
Comment: NEGATIVE
Comment: NEGATIVE
Comment: NEGATIVE
Comment: NEGATIVE
Comment: NORMAL
Neisseria Gonorrhea: NEGATIVE
Trichomonas: POSITIVE — AB

## 2023-07-16 ENCOUNTER — Encounter: Payer: Self-pay | Admitting: General Practice

## 2023-07-16 DIAGNOSIS — A5901 Trichomonal vulvovaginitis: Secondary | ICD-10-CM

## 2023-07-16 DIAGNOSIS — B379 Candidiasis, unspecified: Secondary | ICD-10-CM

## 2023-07-16 MED ORDER — METRONIDAZOLE 500 MG PO TABS
500.0000 mg | ORAL_TABLET | Freq: Two times a day (BID) | ORAL | 0 refills | Status: AC
Start: 2023-07-16 — End: ?

## 2023-07-16 MED ORDER — TERCONAZOLE 0.4 % VA CREA
1.0000 | TOPICAL_CREAM | Freq: Every day | VAGINAL | 0 refills | Status: AC
Start: 2023-07-16 — End: ?

## 2023-09-19 ENCOUNTER — Encounter (HOSPITAL_COMMUNITY): Payer: Self-pay

## 2023-09-19 ENCOUNTER — Ambulatory Visit (HOSPITAL_COMMUNITY)
Admission: EM | Admit: 2023-09-19 | Discharge: 2023-09-19 | Disposition: A | Discharge status: Home / Self Care | Payer: Managed Care, Other (non HMO) | Attending: Emergency Medicine | Admitting: Emergency Medicine

## 2023-09-19 DIAGNOSIS — T783XXA Angioneurotic edema, initial encounter: Secondary | ICD-10-CM

## 2023-09-19 DIAGNOSIS — K047 Periapical abscess without sinus: Secondary | ICD-10-CM

## 2023-09-19 MED ORDER — DEXAMETHASONE SODIUM PHOSPHATE 10 MG/ML IJ SOLN
INTRAMUSCULAR | Status: AC
  Filled 2023-09-19: qty 1

## 2023-09-19 MED ORDER — AMOXICILLIN-POT CLAVULANATE 875-125 MG PO TABS: 1.0000 | ORAL_TABLET | Freq: Two times a day (BID) | ORAL | 0 refills | Status: DC

## 2023-09-19 MED ORDER — DEXAMETHASONE SODIUM PHOSPHATE 10 MG/ML IJ SOLN
10.0000 mg | Freq: Once | INTRAMUSCULAR | Status: AC
  Administered 2023-09-19: 10 mg via INTRAMUSCULAR

## 2023-09-19 NOTE — ED Triage Notes (Signed)
Pt states that her lips are swollen. X2 days  Pt denies eating anything that caused symptoms.

## 2023-09-19 NOTE — Discharge Instructions (Addendum)
Start taking Augmentin twice daily for 7 days for dental infection coverage. You can take 25mg  Benadryl every 6-8 hours as needed to help with swelling. Do not take more than 6 doses in 24 hours. I have attached some dental resources to follow-up with. Someone should call you over the next week to get you set up with a primary care doctor reagrding blood pressure management. If you develop worsening swelling, trouble breathing, throat and tongue swelling then seek immediate medical treatment in the ER. Return is symptoms persist.

## 2023-09-19 NOTE — ED Provider Notes (Signed)
[facility]    CSN: [id number] Arrival date & time: 09/19/23  1209      History   Chief Complaint Chief Complaint  Patient presents with   Oral Swelling    X2 days.     HPI Anna Welch is a 22 y.o. female.   Patient presents with upper lip swelling x 2 days.  Patient denies any known allergen exposures.  Patient states she took Benadryl with no relief of swelling.     Past Medical History:  Diagnosis Date   Acid reflux    Anemia    Hypertension    Preeclampsia in postpartum period    UTI (urinary tract infection) during pregnancy 05/19/2019   +E. Ferusonii 09/2022; [ ]  TOC    Patient Active Problem List   Diagnosis Date Noted   S/P repeat low transverse C-section 05/22/2023   Status post repeat low transverse cesarean section 05/22/2023   BMI 40.0-44.9, adult (HCC) 05/06/2023   Obesity in pregnancy, antepartum, third trimester 05/06/2023   LGA (large for gestational age) fetus affecting management of mother 05/06/2023   History of postpartum hypertension 05/06/2023   History of cesarean section 04/29/2023   Alpha thalassemia silent carrier 02/28/2023   Supervision of high risk pregnancy, antepartum 02/17/2023   Trichomonal vaginitis during pregnancy in second trimester 12/29/2022   Anemia in pregnancy 06/02/2019   Chronic hypertension affecting pregnancy 05/19/2019    Past Surgical History:  Procedure Laterality Date   CESAREAN SECTION     CESAREAN SECTION N/A 05/22/2023   Procedure: CESAREAN SECTION;  Surgeon: Federico Flake, MD;  Location: [facility];  Service: Obstetrics;  Laterality: N/A;   MULTIPLE TOOTH EXTRACTIONS     TYMPANOSTOMY TUBE PLACEMENT      OB History     Gravida  4   Para  3   Term  3   Preterm  0   AB  1   Living  3      SAB  1   IAB  0   Ectopic  0   Multiple  0   Live Births  3            Home Medications    Prior to Admission medications   Medication Sig Start Date End Date  Taking? Authorizing Provider  amoxicillin-clavulanate (AUGMENTIN) 875-125 MG tablet Take 1 tablet by mouth every 12 (twelve) hours. 09/19/23  Yes Susann Givens, Hennesy Sobalvarro A, NP  furosemide (LASIX) 20 MG tablet Take 1 tablet (20 mg total) by mouth daily for 5 days. Patient not taking: Reported on 05/28/2023 05/24/23 05/29/23  Autry-Lott, Randa Evens, DO    Family History Family History  Problem Relation Age of Onset   Hypertension Mother    Stroke Father    Hypertension Father     Social History Social History   Tobacco Use   Smoking status: Never   Smokeless tobacco: Never  Vaping Use   Vaping status: Former  Substance Use Topics   Alcohol use: Not Currently   Drug use: Not Currently     Allergies   Nitrofurantoin   Review of Systems Review of Systems  Constitutional:  Negative for fever.  HENT:  Positive for dental problem. Negative for mouth sores and trouble swallowing.   Respiratory:  Negative for chest tightness and shortness of breath.   Neurological:  Negative for dizziness and headaches.     Physical Exam Triage Vital Signs ED Triage Vitals  Encounter Vitals Group     BP 09/19/23  1224 (!) 163/102     Systolic BP Percentile --      Diastolic BP Percentile --      Pulse Rate 09/19/23 1224 80     Resp 09/19/23 1224 18     Temp 09/19/23 1224 98.4 F (36.9 C)     Temp Source 09/19/23 1224 Oral     SpO2 09/19/23 1224 98 %     Weight --      Height 09/19/23 1223 5\' 9"  (1.753 m)     Head Circumference --      Peak Flow --      Pain Score 09/19/23 1223 0     Pain Loc --      Pain Education --      Exclude from Growth Chart --    No data found.  Updated Vital Signs BP (!) 163/102 (BP Location: Left Arm)   Pulse 80   Temp 98.4 F (36.9 C) (Oral)   Resp 18   Ht 5\' 9"  (1.753 m)   SpO2 98%   Breastfeeding No   BMI 44.18 kg/m   Visual Acuity Right Eye Distance:   Left Eye Distance:   Bilateral Distance:    Right Eye Near:   Left Eye Near:    Bilateral Near:      Physical Exam Vitals and nursing note reviewed.  Constitutional:      General: She is awake. She is not in acute distress.    Appearance: Normal appearance. She is well-developed and well-groomed. She is not ill-appearing, toxic-appearing or diaphoretic.  HENT:     Mouth/Throat:     Mouth: Mucous membranes are moist. Angioedema present. No oral lesions.     Dentition: Abnormal dentition. Dental tenderness, gingival swelling and dental caries present. No dental abscesses or gum lesions.     Tongue: No lesions. Tongue does not deviate from midline.     Palate: No mass and lesions.     Pharynx: Oropharynx is clear.     Comments: Mild-moderate swelling to upper lip and upper gums. Multiple dental caries throughout mouth.  Eyes:     Extraocular Movements: Extraocular movements intact.     Pupils: Pupils are equal, round, and reactive to light.  Skin:    General: Skin is warm and dry.  Neurological:     Mental Status: She is alert and oriented to person, place, and time.     GCS: GCS eye subscore is 4. GCS verbal subscore is 5. GCS motor subscore is 6.  Psychiatric:        Behavior: Behavior is cooperative.      UC Treatments / Results  Labs (all labs ordered are listed, but only abnormal results are displayed) Labs Reviewed - No data to display  EKG   Radiology No results found.  Procedures Procedures (including critical care time)  Medications Ordered in UC Medications  dexamethasone (DECADRON) injection 10 mg (10 mg Intramuscular Given 09/19/23 1248)    Initial Impression / Assessment and Plan / UC Course  I have reviewed the triage vital signs and the nursing notes.  Pertinent labs & imaging results that were available during my care of the patient were reviewed by me and considered in my medical decision making (see chart for details).     Patient presents with 2-day history of upper lip swelling.  Denies any known allergen exposures.  States he took Benadryl  with no relief of swelling.  Endorses mild pain to upper lip.  Denies itching.  Upon assessment patient has mild-moderate swelling to upper lip and upper gums.  Multiple dental caries throughout mouth. EOMI and PERRLA. No neuro deficits upon exam. Patient denies seeing a dentist regularly.  Blood pressure elevated at 163/102 in clinic.  Denies dizziness, blurred vision, headaches, chest pain, confusion, weakness, and numbness.  Patient states she used to take blood pressure medication but is currently not established with a primary care doctor.  Given IM Decadron to help with swelling.  Prescribed Augmentin to cover for dental infection.  Given dental resources and put in for PCP assist.  Discussed follow-up, return, emergency department precautions. Final Clinical Impressions(s) / UC Diagnoses   Final diagnoses:  Angioedema, initial encounter  Dental infection     Discharge Instructions      Start taking Augmentin twice daily for 7 days for dental infection coverage. You can take 25mg  Benadryl every 6-8 hours as needed to help with swelling. Do not take more than 6 doses in 24 hours. I have attached some dental resources to follow-up with. Someone should call you over the next week to get you set up with a primary care doctor reagrding blood pressure management. If you develop worsening swelling, trouble breathing, throat and tongue swelling then seek immediate medical treatment in the ER. Return is symptoms persist.     ED Prescriptions     Medication Sig Dispense Auth. Provider   amoxicillin-clavulanate (AUGMENTIN) 875-125 MG tablet Take 1 tablet by mouth every 12 (twelve) hours. 14 tablet Wynonia Lawman A, NP      PDMP not reviewed this encounter.   Wynonia Lawman A, NP 09/19/23 1253

## 2023-09-20 ENCOUNTER — Ambulatory Visit (HOSPITAL_COMMUNITY)
Admission: RE | Admit: 2023-09-20 | Discharge: 2023-09-20 | Disposition: A | Discharge status: Home / Self Care | Payer: Managed Care, Other (non HMO) | Source: Ambulatory Visit | Attending: Internal Medicine

## 2023-09-20 ENCOUNTER — Encounter (HOSPITAL_COMMUNITY): Payer: Self-pay

## 2023-09-20 ENCOUNTER — Ambulatory Visit (INDEPENDENT_AMBULATORY_CARE_PROVIDER_SITE_OTHER): Payer: Managed Care, Other (non HMO)

## 2023-09-20 VITALS — BP 144/92 | HR 59 | Temp 98.5°F | Resp 16

## 2023-09-20 DIAGNOSIS — S86911A Strain of unspecified muscle(s) and tendon(s) at lower leg level, right leg, initial encounter: Secondary | ICD-10-CM

## 2023-09-20 NOTE — ED Provider Notes (Signed)
[facility]    CSN: [id number] Arrival date & time: 09/20/23  1424      History   Chief Complaint Chief Complaint  Patient presents with   Knee Injury    I fell yesterday and is having issues walking - Entered by patient    HPI Anna Welch is a 22 y.o. female.   Patient presents to clinic for complaints of right knee pain and swelling after a fall outside yesterday.  She had had some alcohol and was trying to chase her friend when she fell and landed on her right knee.  Since the injury she has been ambulatory.  She has not tried any medication for the pain.  Reports pain is only with full extension and sometimes with ambulation.  No deformity.  She was seen yesterday for lip swelling, reports this is improving.  The history is provided by the patient and medical records.    Past Medical History:  Diagnosis Date   Acid reflux    Anemia    Hypertension    Preeclampsia in postpartum period    UTI (urinary tract infection) during pregnancy 05/19/2019   +E. Ferusonii 09/2022; [ ]  TOC    Patient Active Problem List   Diagnosis Date Noted   S/P repeat low transverse C-section 05/22/2023   Status post repeat low transverse cesarean section 05/22/2023   BMI 40.0-44.9, adult (HCC) 05/06/2023   Obesity in pregnancy, antepartum, third trimester 05/06/2023   LGA (large for gestational age) fetus affecting management of mother 05/06/2023   History of postpartum hypertension 05/06/2023   History of cesarean section 04/29/2023   Alpha thalassemia silent carrier 02/28/2023   Supervision of high risk pregnancy, antepartum 02/17/2023   Trichomonal vaginitis during pregnancy in second trimester 12/29/2022   Anemia in pregnancy 06/02/2019   Chronic hypertension affecting pregnancy 05/19/2019    Past Surgical History:  Procedure Laterality Date   CESAREAN SECTION     CESAREAN SECTION N/A 05/22/2023   Procedure: CESAREAN SECTION;  Surgeon: Federico Flake, MD;   Location: [facility];  Service: Obstetrics;  Laterality: N/A;   MULTIPLE TOOTH EXTRACTIONS     TYMPANOSTOMY TUBE PLACEMENT      OB History     Gravida  4   Para  3   Term  3   Preterm  0   AB  1   Living  3      SAB  1   IAB  0   Ectopic  0   Multiple  0   Live Births  3            Home Medications    Prior to Admission medications   Medication Sig Start Date End Date Taking? Authorizing Provider  amoxicillin-clavulanate (AUGMENTIN) 875-125 MG tablet Take 1 tablet by mouth every 12 (twelve) hours. Patient not taking: Reported on 09/20/2023 09/19/23   Wynonia Lawman A, NP  furosemide (LASIX) 20 MG tablet Take 1 tablet (20 mg total) by mouth daily for 5 days. Patient not taking: Reported on 05/28/2023 05/24/23 05/29/23  Autry-Lott, Randa Evens, DO    Family History Family History  Problem Relation Age of Onset   Hypertension Mother    Stroke Father    Hypertension Father     Social History Social History   Tobacco Use   Smoking status: Never   Smokeless tobacco: Never  Vaping Use   Vaping status: Former  Substance Use Topics   Alcohol use: Not Currently   Drug  use: Not Currently     Allergies   Nitrofurantoin   Review of Systems Review of Systems  Musculoskeletal:  Positive for joint swelling. Negative for gait problem.     Physical Exam Triage Vital Signs ED Triage Vitals  Encounter Vitals Group     BP 09/20/23 1501 (!) 144/92     Systolic BP Percentile --      Diastolic BP Percentile --      Pulse Rate 09/20/23 1501 (!) 59     Resp 09/20/23 1501 16     Temp 09/20/23 1501 98.5 F (36.9 C)     Temp src --      SpO2 09/20/23 1501 98 %     Weight --      Height --      Head Circumference --      Peak Flow --      Pain Score 09/20/23 1500 6     Pain Loc --      Pain Education --      Exclude from Growth Chart --    No data found.  Updated Vital Signs BP (!) 144/92 (BP Location: Right Arm) Comment: pt to see PCP for hypertension   Pulse (!) 59   Temp 98.5 F (36.9 C)   Resp 16   LMP 08/27/2023 (Approximate) Comment: Gave birth in May  SpO2 98%   Breastfeeding No   Visual Acuity Right Eye Distance:   Left Eye Distance:   Bilateral Distance:    Right Eye Near:   Left Eye Near:    Bilateral Near:     Physical Exam Vitals and nursing note reviewed.  Constitutional:      Appearance: Normal appearance.  HENT:     Head: Normocephalic and atraumatic.     Right Ear: External ear normal.     Left Ear: External ear normal.     Nose: Nose normal.     Mouth/Throat:     Mouth: Mucous membranes are moist.  Eyes:     Conjunctiva/sclera: Conjunctivae normal.  Cardiovascular:     Rate and Rhythm: Normal rate.     Pulses: Normal pulses.  Pulmonary:     Effort: Pulmonary effort is normal. No respiratory distress.  Musculoskeletal:        General: Swelling present. No tenderness or deformity. Normal range of motion.     Right knee: No bony tenderness. Normal range of motion. No tenderness. No MCL laxity or ACL laxity. Normal alignment. Normal pulse.     Comments: Pain with full extension of knee. Reports generalized intermittent knee pain.   Skin:    General: Skin is warm and dry.  Neurological:     General: No focal deficit present.     Mental Status: She is alert and oriented to person, place, and time.  Psychiatric:        Mood and Affect: Mood normal.        Behavior: Behavior normal. Behavior is cooperative.      UC Treatments / Results  Labs (all labs ordered are listed, but only abnormal results are displayed) Labs Reviewed - No data to display  EKG   Radiology No results found.  Procedures Procedures (including critical care time)  Medications Ordered in UC Medications - No data to display  Initial Impression / Assessment and Plan / UC Course  I have reviewed the triage vital signs and the nursing notes.  Pertinent labs & imaging results that were available during my care of  the  patient were reviewed by me and considered in my medical decision making (see chart for details).  Vitals and triage reviewed, patient is hemodynamically stable.  Subjective right knee swelling after fall.  Ambulatory.  Imaging unremarkable, no obvious fractures or dislocation, awaiting official radiology overread.  Range of motion intact, pain with full extension.  Provided with knee brace in clinic for support.  Advised RICE method.  Ortho follow-up if needed.  Plan of care, follow-up care and return precautions given, no questions at this time.     Final Clinical Impressions(s) / UC Diagnoses   Final diagnoses:  Knee strain, right, initial encounter     Discharge Instructions      Your x-ray did not show any obvious breaks or fractures.  Please rest, ice, compress and elevate your knee.  You can take 800 mg of ibuprofen every 8 hours with food to help with pain and inflammation.  If your pain persist beyond the next week, follow-up with an orthopedic for further evaluation.  Return to clinic for any new or urgent symptoms.     ED Prescriptions   None    PDMP not reviewed this encounter.   Kaymon Denomme, [place] N, [place] 09/20/23 [phone]

## 2023-09-20 NOTE — ED Triage Notes (Signed)
Pt fell yesterday outside and C/O right knee pain and swelling.

## 2023-09-20 NOTE — Discharge Instructions (Signed)
Your x-ray did not show any obvious breaks or fractures.  Please rest, ice, compress and elevate your knee.  You can take 800 mg of ibuprofen every 8 hours with food to help with pain and inflammation.  If your pain persist beyond the next week, follow-up with an orthopedic for further evaluation.  Return to clinic for any new or urgent symptoms.

## 2023-10-12 ENCOUNTER — Other Ambulatory Visit: Payer: Self-pay

## 2023-10-12 ENCOUNTER — Ambulatory Visit (HOSPITAL_COMMUNITY)
Admission: EM | Admit: 2023-10-12 | Discharge: 2023-10-12 | Disposition: A | Discharge status: Home / Self Care | Payer: Commercial Managed Care - HMO | Attending: Family Medicine | Admitting: Family Medicine

## 2023-10-12 ENCOUNTER — Encounter (HOSPITAL_COMMUNITY): Payer: Self-pay | Admitting: Emergency Medicine

## 2023-10-12 ENCOUNTER — Ambulatory Visit (INDEPENDENT_AMBULATORY_CARE_PROVIDER_SITE_OTHER): Payer: Commercial Managed Care - HMO

## 2023-10-12 DIAGNOSIS — Z975 Presence of (intrauterine) contraceptive device: Secondary | ICD-10-CM | POA: Diagnosis present

## 2023-10-12 DIAGNOSIS — G43919 Migraine, unspecified, intractable, without status migrainosus: Secondary | ICD-10-CM | POA: Diagnosis present

## 2023-10-12 DIAGNOSIS — N939 Abnormal uterine and vaginal bleeding, unspecified: Secondary | ICD-10-CM | POA: Diagnosis not present

## 2023-10-12 DIAGNOSIS — T85618A Breakdown (mechanical) of other specified internal prosthetic devices, implants and grafts, initial encounter: Secondary | ICD-10-CM | POA: Diagnosis not present

## 2023-10-12 DIAGNOSIS — R102 Pelvic and perineal pain: Secondary | ICD-10-CM | POA: Insufficient documentation

## 2023-10-12 LAB — POCT URINE PREGNANCY: Preg Test, Ur: NEGATIVE

## 2023-10-12 MED ORDER — SUMATRIPTAN SUCCINATE 6 MG/0.5ML ~~LOC~~ SOLN
SUBCUTANEOUS | Status: AC
  Filled 2023-10-12: qty 0.5

## 2023-10-12 MED ORDER — KETOROLAC TROMETHAMINE 30 MG/ML IJ SOLN
INTRAMUSCULAR | Status: AC
  Filled 2023-10-12: qty 1

## 2023-10-12 MED ORDER — SUMATRIPTAN SUCCINATE 6 MG/0.5ML ~~LOC~~ SOLN
6.0000 mg | Freq: Once | SUBCUTANEOUS | Status: AC
  Administered 2023-10-12: 6 mg via SUBCUTANEOUS

## 2023-10-12 MED ORDER — SUMATRIPTAN SUCCINATE 100 MG PO TABS: 100.0000 mg | ORAL_TABLET | ORAL | 0 refills | Status: DC | PRN

## 2023-10-12 MED ORDER — KETOROLAC TROMETHAMINE 30 MG/ML IJ SOLN
30.0000 mg | Freq: Once | INTRAMUSCULAR | Status: AC
  Administered 2023-10-12: 30 mg via INTRAMUSCULAR

## 2023-10-12 NOTE — ED Provider Notes (Addendum)
[facility]    CSN: [id number] Arrival date & time: 10/12/23  1439      History   Chief Complaint Chief Complaint  Patient presents with   nexplanon concern    HPI Anna Welch is a 22 y.o. female.   HPI Here for irregular menstrual cycles and headache.  In the last 2 to 3 weeks she has noted migraine type headaches with photophobia and nausea.  She is also had some menstrual bleeding ever since October 5.  She is having some pelvic cramping.  No dysuria.  She has the Nexplanon that was placed on June 1.  She feels that it feels abnormal or different than it did initially.   Past Medical History:  Diagnosis Date   Acid reflux    Anemia    Hypertension    Preeclampsia in postpartum period    UTI (urinary tract infection) during pregnancy 05/19/2019   +E. Ferusonii 09/2022; [ ]  TOC    Patient Active Problem List   Diagnosis Date Noted   S/P repeat low transverse C-section 05/22/2023   Status post repeat low transverse cesarean section 05/22/2023   BMI 40.0-44.9, adult (HCC) 05/06/2023   Obesity in pregnancy, antepartum, third trimester 05/06/2023   LGA (large for gestational age) fetus affecting management of mother 05/06/2023   History of postpartum hypertension 05/06/2023   History of cesarean section 04/29/2023   Alpha thalassemia silent carrier 02/28/2023   Supervision of high risk pregnancy, antepartum 02/17/2023   Trichomonal vaginitis during pregnancy in second trimester 12/29/2022   Anemia in pregnancy 06/02/2019   Chronic hypertension affecting pregnancy 05/19/2019    Past Surgical History:  Procedure Laterality Date   CESAREAN SECTION     CESAREAN SECTION N/A 05/22/2023   Procedure: CESAREAN SECTION;  Surgeon: Federico Flake, MD;  Location: [facility];  Service: Obstetrics;  Laterality: N/A;   MULTIPLE TOOTH EXTRACTIONS     TYMPANOSTOMY TUBE PLACEMENT      OB History     Gravida  4   Para  3   Term  3   Preterm  0    AB  1   Living  3      SAB  1   IAB  0   Ectopic  0   Multiple  0   Live Births  3            Home Medications    Prior to Admission medications   Medication Sig Start Date End Date Taking? Authorizing Provider  NON FORMULARY Takes a blood pressure medicine that starts with a "N"   Yes [provider]  Prenatal Vit-Fe Fumarate-FA (PRENATAL VITAMINS PO) Take by mouth.   Yes [provider]  SUMAtriptan (IMITREX) 100 MG tablet Take 1 tablet (100 mg total) by mouth every 2 (two) hours as needed for migraine. May repeat in 2 hours if headache persists or recurs. 10/12/23  Yes Zenia Resides, MD  amoxicillin-clavulanate (AUGMENTIN) 875-125 MG tablet Take 1 tablet by mouth every 12 (twelve) hours. Patient not taking: Reported on 09/20/2023 09/19/23   Wynonia Lawman A, NP  furosemide (LASIX) 20 MG tablet Take 1 tablet (20 mg total) by mouth daily for 5 days. Patient not taking: Reported on 05/28/2023 05/24/23 05/29/23  Autry-Lott, Randa Evens, DO    Family History Family History  Problem Relation Age of Onset   Hypertension Mother    Stroke Father    Hypertension Father     Social History Social History  Tobacco Use   Smoking status: Never   Smokeless tobacco: Never  Vaping Use   Vaping status: Former  Substance Use Topics   Alcohol use: Yes   Drug use: Not Currently     Allergies   Nitrofurantoin   Review of Systems Review of Systems   Physical Exam Triage Vital Signs ED Triage Vitals  Encounter Vitals Group     BP 10/12/23 1513 (!) 153/100     Systolic BP Percentile --      Diastolic BP Percentile --      Pulse Rate 10/12/23 1513 (!) 58     Resp 10/12/23 1513 20     Temp 10/12/23 1513 98.6 F (37 C)     Temp Source 10/12/23 1513 Oral     SpO2 10/12/23 1513 98 %     Weight --      Height --      Head Circumference --      Peak Flow --      Pain Score 10/12/23 1507 0     Pain Loc --      Pain Education --      Exclude from  Growth Chart --    No data found.  Updated Vital Signs BP (!) 141/87 (BP Location: Right Arm) Comment: regular cuff/forearm  Pulse (!) 58   Temp 98.6 F (37 C) (Oral)   Resp 20   LMP 09/27/2023 (Approximate) Comment: Gave birth in May  SpO2 98%   Breastfeeding No   Visual Acuity Right Eye Distance:   Left Eye Distance:   Bilateral Distance:    Right Eye Near:   Left Eye Near:    Bilateral Near:     Physical Exam Vitals reviewed.  Constitutional:      General: She is not in acute distress.    Appearance: She is not ill-appearing, toxic-appearing or diaphoretic.  HENT:     Mouth/Throat:     Mouth: Mucous membranes are moist.  Eyes:     Extraocular Movements: Extraocular movements intact.     Conjunctiva/sclera: Conjunctivae normal.     Pupils: Pupils are equal, round, and reactive to light.  Cardiovascular:     Rate and Rhythm: Normal rate and regular rhythm.     Heart sounds: No murmur heard. Pulmonary:     Effort: Pulmonary effort is normal.     Breath sounds: Normal breath sounds.  Abdominal:     Palpations: Abdomen is soft.     Tenderness: There is no abdominal tenderness.  Musculoskeletal:     Cervical back: Neck supple.     Comments: In the subcu tissue of the medial left upper arm about 8 to 10 cm proximal to the elbow, there is a rod that is palpable.  It appears to be broken into 2 or 3 sections.  Lymphadenopathy:     Cervical: No cervical adenopathy.  Skin:    Coloration: Skin is not jaundiced or pale.  Neurological:     General: No focal deficit present.     Mental Status: She is alert and oriented to person, place, and time.  Psychiatric:        Behavior: Behavior normal.      UC Treatments / Results  Labs (all labs ordered are listed, but only abnormal results are displayed) Labs Reviewed  POCT URINE PREGNANCY  CERVICOVAGINAL ANCILLARY ONLY    EKG   Radiology No results found.  Procedures Procedures (including critical care  time)  Medications Ordered in UC Medications  ketorolac (TORADOL) 30 MG/ML injection 30 mg (30 mg Intramuscular Given 10/12/23 1644)  SUMAtriptan (IMITREX) injection 6 mg (6 mg Subcutaneous Given 10/12/23 1647)    Initial Impression / Assessment and Plan / UC Course  I have reviewed the triage vital signs and the nursing notes.  Pertinent labs & imaging results that were available during my care of the patient were reviewed by me and considered in my medical decision making (see chart for details).    UPT is negative X-ray does show her IUD to be curved and also cracked in about 3 places.  The images have to be enlarged to be able to see the cracks.  I have asked her to follow-up with her GYN to probably have it removed.  Toradol and Imitrex are given here as injections and Imitrex tablets are sent into the pharmacy.  Vaginal self swab is done, and we will notify of any positives on that and treat per protocol.  Final Clinical Impressions(s) / UC Diagnoses   Final diagnoses:  Intractable migraine without status migrainosus, unspecified migraine type  Pelvic pain  Presence of subcutaneous contraceptive implant     Discharge Instructions      Pregnancy test was negative.  Staff will notify you if there is anything positive on the swab.  It does look like your Nexplanon is cracked in several places, and it's curved. Please followup with your gyn.  You have been given a shot of Toradol 30 mg and of sumatriptan 6 mg today.  Sumatriptan 100 mg--take 1 tablet as needed for migraine headache.  If in 2 hours, the headache persist, you may repeat the dose once more.  Do not take more than 2 tablets in 24 hours.        ED Prescriptions     Medication Sig Dispense Auth. Provider   SUMAtriptan (IMITREX) 100 MG tablet Take 1 tablet (100 mg total) by mouth every 2 (two) hours as needed for migraine. May repeat in 2 hours if headache persists or recurs. 10 tablet Marlinda Mike Janace Aris, MD      PDMP not reviewed this encounter.   Zenia Resides, MD 10/12/23 1626    Zenia Resides, MD 10/12/23 [phone]

## 2023-10-12 NOTE — Discharge Instructions (Addendum)
Pregnancy test was negative.  Staff will notify you if there is anything positive on the swab.  It does look like your Nexplanon is cracked in several places, and it's curved. Please followup with your gyn.  You have been given a shot of Toradol 30 mg and of sumatriptan 6 mg today.  Sumatriptan 100 mg--take 1 tablet as needed for migraine headache.  If in 2 hours, the headache persist, you may repeat the dose once more.  Do not take more than 2 tablets in 24 hours.

## 2023-10-12 NOTE — ED Triage Notes (Signed)
Patient has concerns about nexplanon in left upper ar,  patient thinks it has moved in her arm.  Palpating nexplanon, feels like a dent is present.  States she has talked to her pcp, but has not had follow up.    Lights hurting her eyes, current headache started on 10/15.  Patient reports taking aleve and no relief.

## 2023-10-13 ENCOUNTER — Telehealth: Payer: Self-pay

## 2023-10-13 ENCOUNTER — Encounter: Payer: Self-pay | Admitting: Family Medicine

## 2023-10-13 ENCOUNTER — Ambulatory Visit: Payer: Managed Care, Other (non HMO) | Admitting: Family Medicine

## 2023-10-13 VITALS — BP 148/91 | HR 76 | Wt 304.6 lb

## 2023-10-13 DIAGNOSIS — Z30011 Encounter for initial prescription of contraceptive pills: Secondary | ICD-10-CM | POA: Diagnosis not present

## 2023-10-13 DIAGNOSIS — G43E19 Chronic migraine with aura, intractable, without status migrainosus: Secondary | ICD-10-CM

## 2023-10-13 DIAGNOSIS — Z3046 Encounter for surveillance of implantable subdermal contraceptive: Secondary | ICD-10-CM

## 2023-10-13 DIAGNOSIS — I10 Essential (primary) hypertension: Secondary | ICD-10-CM | POA: Diagnosis not present

## 2023-10-13 LAB — CERVICOVAGINAL ANCILLARY ONLY
Bacterial Vaginitis (gardnerella): POSITIVE — AB
Candida Glabrata: NEGATIVE
Candida Vaginitis: NEGATIVE
Chlamydia: NEGATIVE
Comment: NEGATIVE
Comment: NEGATIVE
Comment: NEGATIVE
Comment: NEGATIVE
Comment: NEGATIVE
Comment: NORMAL
Neisseria Gonorrhea: NEGATIVE
Trichomonas: POSITIVE — AB

## 2023-10-13 MED ORDER — SLYND 4 MG PO TABS: 1.0000 | ORAL_TABLET | Freq: Every day | ORAL | 4 refills | Status: AC

## 2023-10-13 MED ORDER — HYDROCHLOROTHIAZIDE 25 MG PO TABS
25.0000 mg | ORAL_TABLET | Freq: Every day | ORAL | 3 refills | Status: AC
Start: 2023-10-13 — End: ?

## 2023-10-13 MED ORDER — METRONIDAZOLE 500 MG PO TABS: 500.0000 mg | ORAL_TABLET | Freq: Two times a day (BID) | ORAL | 0 refills | Status: AC

## 2023-10-13 MED ORDER — SLYND 4 MG PO TABS
1.0000 | ORAL_TABLET | Freq: Every day | ORAL | 4 refills | Status: DC
Start: 2023-10-13 — End: 2023-10-13

## 2023-10-13 NOTE — Telephone Encounter (Signed)
Per protocol, pt requires tx with metronidazole. Reviewed with patient, verified pharmacy, prescription sent. Contacted patient by phone.  Verified identity using two identifiers.  Provided positive result.  Reviewed safe sex practices, notifying partners, and refraining from sexual activities for 7 days from time of treatment.  Patient verified understanding, all questions answered.

## 2023-10-13 NOTE — Progress Notes (Signed)
Contraception/Family Planning VISIT ENCOUNTER NOTE  Subjective:   Anna Welch is a 22 y.o. [phone] female here for reproductive life counseling.  Desires better bleeding predictability from [facility].  Reports she does not want a pregnancy in the next year.  Reports irregular cycles for 4+ months since placement. She also notes fatigue and other symptoms, unlikely related. She reports she will spot pretty much daily. Desires Nexplanon removed today.   Denies abnormal vaginal bleeding, discharge, pelvic pain, problems with intercourse or other gynecologic concerns.    Gynecologic History Patient's last menstrual period was 09/27/2023 (approximate). Contraception: Nexplanon  Health Maintenance Due  Topic Date Due   HPV VACCINES (1 - 3-dose series) Never done   Cervical Cancer Screening (Pap smear)  Never done   INFLUENZA VACCINE  Never done   COVID-19 Vaccine (1 - 2023-24 season) Never done   The following portions of the patient's history were reviewed and updated as appropriate: allergies, current medications, past family history, past medical history, past social history, past surgical history and problem list.  Review of Systems Pertinent items are noted in HPI.   Objective:  BP (!) 148/91   Pulse 76   Wt (!) 304 lb 9.6 oz (138.2 kg)   LMP 09/27/2023 (Approximate) Comment: Gave birth in May  BMI 44.98 kg/m  Gen: well appearing, NAD HEENT: no scleral icterus CV: RR Lung: Normal WOB Ext: warm well perfused  Nexplanon Removal Patient identified, informed consent performed, consent signed.   Appropriate time out taken. Nexplanon site identified.  Area prepped in usual sterile fashon. One ml of 1% lidocaine was used to anesthetize the area at the distal end of the implant. A small stab incision was made right beside the implant on the distal portion.  The Nexplanon rod was grasped using hemostats and removed without difficulty.  There was minimal blood loss. There were no  complications.  3 ml of 1% lidocaine was injected around the incision for post-procedure analgesia.  Steri-strips were applied over the small incision.  A pressure bandage was applied to reduce any bruising.  The patient tolerated the procedure well and was given post procedure instructions.  Patient is planning to use POP for contraception/attempt conception.  Assessment and Plan:   Contraception counseling:  Patient desires POP, this was prescribed for patient. She will follow up in  1 year for surveillance.  She was told to call with any further questions, or with any concerns about this method of contraception.  Emphasized use of condoms 100% of the time for STI prevention.  1. OCP (oral contraceptive pills) initiation - Drospirenone (SLYND) 4 MG TABS; Take 1 tablet (4 mg total) by mouth daily.  Dispense: 84 tablet; Refill: 4  2. Nexplanon removal - Drospirenone (SLYND) 4 MG TABS; Take 1 tablet (4 mg total) by mouth daily.  Dispense: 84 tablet; Refill: 4  3. Intractable chronic migraine with aura and without status migrainosus - Drospirenone (SLYND) 4 MG TABS; Take 1 tablet (4 mg total) by mouth daily.  Dispense: 84 tablet; Refill: 4  4. Chronic hypertension Has not established with PCP yet BP elevated and discussed starting medication - hydrochlorothiazide (HYDRODIURIL) 25 MG tablet; Take 1 tablet (25 mg total) by mouth daily.  Dispense: 30 tablet; Refill: 3  Please refer to After Visit Summary for other counseling recommendations.   Return in about 2 weeks (around 10/27/2023) for BP check.  No future appointments.   Federico Flake, MD, MPH, ABFM Attending Physician Faculty Practice- Center  for [facility] for [employer], [facility]

## 2023-11-13 ENCOUNTER — Encounter (HOSPITAL_COMMUNITY): Payer: Self-pay | Admitting: Emergency Medicine

## 2023-11-13 ENCOUNTER — Other Ambulatory Visit: Payer: Self-pay

## 2023-11-13 ENCOUNTER — Emergency Department (HOSPITAL_COMMUNITY)
Admission: EM | Admit: 2023-11-13 | Discharge: 2023-11-14 | Discharge status: Against Medical Advice | Payer: Commercial Managed Care - HMO | Attending: Emergency Medicine | Admitting: Emergency Medicine

## 2023-11-13 DIAGNOSIS — K0889 Other specified disorders of teeth and supporting structures: Secondary | ICD-10-CM | POA: Insufficient documentation

## 2023-11-13 DIAGNOSIS — Z5321 Procedure and treatment not carried out due to patient leaving prior to being seen by health care provider: Secondary | ICD-10-CM | POA: Diagnosis not present

## 2023-11-13 NOTE — ED Triage Notes (Signed)
Pt in with R sided dental pain x 1 wk, denies any fevers.

## 2023-12-24 ENCOUNTER — Encounter (HOSPITAL_COMMUNITY): Payer: Self-pay | Admitting: Emergency Medicine

## 2023-12-24 ENCOUNTER — Emergency Department (HOSPITAL_COMMUNITY)
Admission: EM | Admit: 2023-12-24 | Discharge: 2023-12-25 | Discharge status: Against Medical Advice | Payer: Medicaid Other | Attending: Medical | Admitting: Medical

## 2023-12-24 ENCOUNTER — Other Ambulatory Visit: Payer: Self-pay

## 2023-12-24 DIAGNOSIS — R519 Headache, unspecified: Secondary | ICD-10-CM | POA: Insufficient documentation

## 2023-12-24 DIAGNOSIS — Z5321 Procedure and treatment not carried out due to patient leaving prior to being seen by health care provider: Secondary | ICD-10-CM | POA: Insufficient documentation

## 2023-12-24 DIAGNOSIS — H538 Other visual disturbances: Secondary | ICD-10-CM | POA: Diagnosis not present

## 2023-12-24 LAB — PREGNANCY, URINE: Preg Test, Ur: NEGATIVE

## 2023-12-24 MED ORDER — ACETAMINOPHEN 500 MG PO TABS
1000.0000 mg | ORAL_TABLET | Freq: Once | ORAL | Status: AC
  Administered 2023-12-24: 1000 mg via ORAL
  Filled 2023-12-24: qty 2

## 2023-12-24 NOTE — ED Provider Triage Note (Signed)
 Emergency Medicine Provider Triage Evaluation Note  Anna Welch , a 23 y.o. female  was evaluated in triage.  Pt complains of frontal headachex3 days w/light sensitivity and nausea w/some blurred vision. Also concerned she is pregnant.   Review of Systems  Positive: headache Negative: Speech changes  Physical Exam  BP (!) 132/99 (BP Location: Right Arm)   Pulse 80   Temp 98.8 F (37.1 C) (Oral)   Resp 18   Wt (!) 138 kg   LMP 10/12/2023 (Approximate)   SpO2 99%   BMI 44.93 kg/m  Gen:   Awake, no distress   Resp:  Normal effort  MSK:   Moves extremities without difficulty  Other:  No neurodeficits  Medical Decision Making  Medically screening exam initiated at 9:28 PM.  Appropriate orders placed.  Anna Welch was informed that the remainder of the evaluation will be completed by another provider, this initial triage assessment does not replace that evaluation, and the importance of remaining in the ED until their evaluation is complete.     Philippa Lyle CROME, [place] 12/24/23 2129

## 2023-12-24 NOTE — ED Notes (Signed)
Pt ambulatory to waiting room

## 2023-12-24 NOTE — ED Triage Notes (Signed)
 Pt states nexplanon broke in arm and was removed in October. Pt was then placed on Birth control pills but states that migraines were really bad so stopped taking birth control in November.

## 2023-12-24 NOTE — ED Triage Notes (Signed)
 BIB [facility] from home migraine for several days. Dizziness, sharp pains in face.  Sensitivity to light. Chance of pregnancy no cycle in 3 months.    VS 139/78 Pulse 65 100% Ra

## 2024-01-12 ENCOUNTER — Encounter (HOSPITAL_COMMUNITY): Payer: Self-pay | Admitting: Emergency Medicine

## 2024-01-12 ENCOUNTER — Other Ambulatory Visit: Payer: Self-pay

## 2024-01-12 ENCOUNTER — Ambulatory Visit (HOSPITAL_COMMUNITY)
Admission: EM | Admit: 2024-01-12 | Discharge: 2024-01-12 | Disposition: A | Discharge status: Home / Self Care | Payer: Medicaid Other | Attending: Family Medicine | Admitting: Family Medicine

## 2024-01-12 DIAGNOSIS — K047 Periapical abscess without sinus: Secondary | ICD-10-CM | POA: Diagnosis not present

## 2024-01-12 MED ORDER — NAPROXEN 500 MG PO TABS: 500.0000 mg | ORAL_TABLET | Freq: Two times a day (BID) | ORAL | 0 refills | Status: AC | PRN

## 2024-01-12 MED ORDER — CLINDAMYCIN HCL 300 MG PO CAPS: 300.0000 mg | ORAL_CAPSULE | Freq: Three times a day (TID) | ORAL | 0 refills | Status: AC

## 2024-01-12 NOTE — Discharge Instructions (Signed)
--  Take clindamycin 300 mg-- 1 capsule 3 times daily for 7 days  Take naproxen 500 mg--1 tablet every 12 hours as needed for pain

## 2024-01-12 NOTE — ED Provider Notes (Signed)
[facility]    CSN: [id number] Arrival date & time: 01/12/24  0944      History   Chief Complaint Chief Complaint  Patient presents with   Otalgia   Dental Pain    HPI Anna Welch is a 23 y.o. female.    Otalgia Dental Pain Here for pain in her lower jaw bilaterally that clear her wisdom teeth are.  She is unsure if it is from her teeth or if her ears are causing the trouble.  No fever or cough or congestion.  No vomiting or diarrhea  She is allergic to amoxicillin which causes a rash and to nitrofurantoin which causes a rash.  Last menstrual cycle was in October.  She is on oral contraceptives and she did have a negative pregnancy test about 2 weeks ago when she had checked in to be evaluated at the emergency room  Past Medical History:  Diagnosis Date   Acid reflux    Anemia    Hypertension    Preeclampsia in postpartum period    UTI (urinary tract infection) during pregnancy 05/19/2019   +E. Ferusonii 09/2022; [ ]  TOC    Patient Active Problem List   Diagnosis Date Noted   S/P repeat low transverse C-section 05/22/2023   Status post repeat low transverse cesarean section 05/22/2023   BMI 40.0-44.9, adult (HCC) 05/06/2023   Obesity in pregnancy, antepartum, third trimester 05/06/2023   LGA (large for gestational age) fetus affecting management of mother 05/06/2023   History of postpartum hypertension 05/06/2023   History of cesarean section 04/29/2023   Alpha thalassemia silent carrier 02/28/2023   Supervision of high risk pregnancy, antepartum 02/17/2023   Trichomonal vaginitis during pregnancy in second trimester 12/29/2022   Anemia in pregnancy 06/02/2019   Chronic hypertension affecting pregnancy 05/19/2019    Past Surgical History:  Procedure Laterality Date   CESAREAN SECTION     CESAREAN SECTION N/A 05/22/2023   Procedure: CESAREAN SECTION;  Surgeon: Federico Flake, MD;  Location: [facility];  Service: Obstetrics;  Laterality:  N/A;   MULTIPLE TOOTH EXTRACTIONS     TYMPANOSTOMY TUBE PLACEMENT      OB History     Gravida  4   Para  3   Term  3   Preterm  0   AB  1   Living  3      SAB  1   IAB  0   Ectopic  0   Multiple  0   Live Births  3            Home Medications    Prior to Admission medications   Medication Sig Start Date End Date Taking? Authorizing Provider  clindamycin (CLEOCIN) 300 MG capsule Take 1 capsule (300 mg total) by mouth 3 (three) times daily for 5 days. 01/12/24 01/17/24 Yes Zenia Resides, MD  naproxen (NAPROSYN) 500 MG tablet Take 1 tablet (500 mg total) by mouth 2 (two) times daily as needed (pain). 01/12/24  Yes Kathyann Spaugh, Janace Aris, MD  Drospirenone (SLYND) 4 MG TABS Take 1 tablet (4 mg total) by mouth daily. Patient not taking: Reported on 01/12/2024 10/13/23   Federico Flake, MD  hydrochlorothiazide (HYDRODIURIL) 25 MG tablet Take 1 tablet (25 mg total) by mouth daily. Patient not taking: Reported on 01/12/2024 10/13/23   Federico Flake, MD  NON FORMULARY Takes a blood pressure medicine that starts with a "N" Patient not taking: Reported on 10/13/2023    [provider]    Family History Family History  Problem Relation Age of Onset   Hypertension Mother    Stroke Father    Hypertension Father     Social History Social History   Tobacco Use   Smoking status: Never   Smokeless tobacco: Never  Vaping Use   Vaping status: Former  Substance Use Topics   Alcohol use: Yes   Drug use: Not Currently     Allergies   Amoxicillin and Nitrofurantoin   Review of Systems Review of Systems  HENT:  Positive for ear pain.      Physical Exam Triage Vital Signs ED Triage Vitals  Encounter Vitals Group     BP 01/12/24 1106 (!) 149/89     Systolic BP Percentile --      Diastolic BP Percentile --      Pulse Rate 01/12/24 1106 77     Resp 01/12/24 1106 18     Temp 01/12/24 1106 99 F (37.2 C)     Temp Source 01/12/24 1106  Oral     SpO2 01/12/24 1106 97 %     Weight --      Height --      Head Circumference --      Peak Flow --      Pain Score 01/12/24 1103 5     Pain Loc --      Pain Education --      Exclude from Growth Chart --    No data found.  Updated Vital Signs BP (!) 149/89 (BP Location: Right Arm) Comment (BP Location): large cuff  Pulse 77   Temp 99 F (37.2 C) (Oral)   Resp 18   LMP  (LMP Unknown)   SpO2 97%   Breastfeeding No   Visual Acuity Right Eye Distance:   Left Eye Distance:   Bilateral Distance:    Right Eye Near:   Left Eye Near:    Bilateral Near:     Physical Exam Vitals reviewed.  Constitutional:      General: She is not in acute distress.    Appearance: She is not toxic-appearing.  HENT:     Right Ear: Tympanic membrane and ear canal normal.     Left Ear: Tympanic membrane and ear canal normal.     Nose: Nose normal.     Mouth/Throat:     Mouth: Mucous membranes are moist.     Pharynx: No oropharyngeal exudate or posterior oropharyngeal erythema.     Comments: Do not see any obvious swelling. Eyes:     Extraocular Movements: Extraocular movements intact.     Conjunctiva/sclera: Conjunctivae normal.     Pupils: Pupils are equal, round, and reactive to light.  Cardiovascular:     Rate and Rhythm: Normal rate and regular rhythm.     Heart sounds: No murmur heard. Pulmonary:     Effort: Pulmonary effort is normal. No respiratory distress.     Breath sounds: No stridor. No wheezing, rhonchi or rales.  Musculoskeletal:     Cervical back: Neck supple.  Lymphadenopathy:     Cervical: No cervical adenopathy.  Skin:    Capillary Refill: Capillary refill takes less than 2 seconds.     Coloration: Skin is not jaundiced or pale.  Neurological:     General: No focal deficit present.     Mental Status: She is alert and oriented to person, place, and time.  Psychiatric:        Behavior: Behavior normal.  UC Treatments / Results  Labs (all labs  ordered are listed, but only abnormal results are displayed) Labs Reviewed - No data to display  EKG   Radiology No results found.  Procedures Procedures (including critical care time)  Medications Ordered in UC Medications - No data to display  Initial Impression / Assessment and Plan / UC Course  I have reviewed the triage vital signs and the nursing notes.  Pertinent labs & imaging results that were available during my care of the patient were reviewed by me and considered in my medical decision making (see chart for details).   Clindamycin is sent in for possible dental infection and some naproxen is sent in for the pain.  She has seen a dentist and will see them in follow-up. Final Clinical Impressions(s) / UC Diagnoses   Final diagnoses:  Dental infection     Discharge Instructions      --Take clindamycin 300 mg-- 1 capsule 3 times daily for 7 days  Take naproxen 500 mg--1 tablet every 12 hours as needed for pain       ED Prescriptions     Medication Sig Dispense Auth. Provider   clindamycin (CLEOCIN) 300 MG capsule Take 1 capsule (300 mg total) by mouth 3 (three) times daily for 5 days. 15 capsule Zenia Resides, MD   naproxen (NAPROSYN) 500 MG tablet Take 1 tablet (500 mg total) by mouth 2 (two) times daily as needed (pain). 30 tablet Brendan Gruwell, Janace Aris, MD      PDMP not reviewed this encounter.   Zenia Resides, MD 01/12/24 1140

## 2024-01-12 NOTE — ED Triage Notes (Signed)
Patient thinks bilateral lower wisdom teeth are causing her pain and patient reports both ears are hurting.  Reports symptoms for 2 weeks.    Has been taking ibuprofen for symptoms

## 2024-08-05 ENCOUNTER — Encounter: Payer: Self-pay | Admitting: Obstetrics and Gynecology

## 2024-08-05 ENCOUNTER — Encounter: Admitting: Obstetrics and Gynecology

## 2024-08-06 NOTE — Progress Notes (Signed)
 Patient did not keep her new GYN appointment for 08/05/2024.  Bebe Izell Raddle MD Attending Center for [employer] [facility])
